# Patient Record
Sex: Female | Born: 1950 | Race: White | Hispanic: No | Marital: Married | Smoking: Former smoker
Health system: Southern US, Community
[De-identification: ages and names within clinical notes are randomized; demographics above are authoritative.]

## PROBLEM LIST (undated history)

## (undated) DIAGNOSIS — I8390 Asymptomatic varicose veins of unspecified lower extremity: Secondary | ICD-10-CM

## (undated) DIAGNOSIS — H8109 Meniere's disease, unspecified ear: Secondary | ICD-10-CM

## (undated) HISTORY — PX: TONSILLECTOMY: SUR1361

## (undated) HISTORY — DX: Asymptomatic varicose veins of unspecified lower extremity: I83.90

## (undated) HISTORY — DX: Meniere's disease, unspecified ear: H81.09

---

## 1985-10-10 HISTORY — PX: OTHER SURGICAL HISTORY: SHX169

## 1986-10-10 HISTORY — PX: ABDOMINAL HYSTERECTOMY: SHX81

## 2003-10-11 HISTORY — PX: BUNIONECTOMY: SHX129

## 2013-09-28 ENCOUNTER — Emergency Department (INDEPENDENT_AMBULATORY_CARE_PROVIDER_SITE_OTHER)
Admission: EM | Admit: 2013-09-28 | Discharge: 2013-09-28 | Disposition: A | Discharge status: Home / Self Care | Payer: 59 | Source: Home / Self Care | Attending: Emergency Medicine | Admitting: Emergency Medicine

## 2013-09-28 ENCOUNTER — Encounter (HOSPITAL_COMMUNITY): Payer: Self-pay | Admitting: Emergency Medicine

## 2013-09-28 DIAGNOSIS — H109 Unspecified conjunctivitis: Secondary | ICD-10-CM

## 2013-09-28 MED ORDER — TOBRAMYCIN 0.3 % OP SOLN
OPHTHALMIC | Status: AC
  Filled 2013-09-28: qty 5

## 2013-09-28 MED ORDER — TOBRAMYCIN 0.3 % OP SOLN
2.0000 [drp] | OPHTHALMIC | Status: DC
  Administered 2013-09-28: 2 [drp] via OPHTHALMIC

## 2013-09-28 NOTE — ED Notes (Signed)
C/o pain, itching since this PM. Encouraged to discard eye make up , practice good hand hygiene

## 2013-09-28 NOTE — ED Provider Notes (Signed)
CSN: [id number]     Arrival date & time 09/28/13  1850 History   First MD Initiated Contact with Patient 09/28/13 2002     Chief Complaint  Patient presents with  . Conjunctivitis   (Consider location/radiation/quality/duration/timing/severity/associated sxs/prior Treatment) HPI Comments: 62 year old female presents complaining of one day of right eye redness, irritation, and purulent drainage. She believes she may have pink eye. Her vision has not been affected. The pain is mild to moderate.   History reviewed. No pertinent past medical history. History reviewed. No pertinent past surgical history. History reviewed. No pertinent family history. History  Substance Use Topics  . Smoking status: Never Smoker   . Smokeless tobacco: Not on file  . Alcohol Use: Yes   OB History   Grav Para Term Preterm Abortions TAB SAB Ect Mult Living                 Review of Systems  Constitutional: Negative for fever and chills.  Eyes: Positive for discharge and redness. Negative for visual disturbance.  Respiratory: Negative for cough and shortness of breath.   Cardiovascular: Negative for chest pain, palpitations and leg swelling.  Gastrointestinal: Negative for nausea, vomiting and abdominal pain.  Endocrine: Negative for polydipsia and polyuria.  Genitourinary: Negative for dysuria, urgency and frequency.  Musculoskeletal: Negative for arthralgias and myalgias.  Skin: Negative for rash.  Neurological: Negative for dizziness, weakness and light-headedness.    Allergies  Review of patient's allergies indicates not on file.  Home Medications  No current outpatient prescriptions on file. BP 154/88  Pulse 61  Temp(Src) 98.1 F (36.7 C) (Oral)  Resp 17  SpO2 99% Physical Exam  Nursing note and vitals reviewed. Constitutional: She is oriented to person, place, and time. Vital signs are normal. She appears well-developed and well-nourished. No distress.  HENT:  Head: Normocephalic and  atraumatic.  Eyes: Right eye exhibits discharge and exudate. Right conjunctiva is injected.  Pulmonary/Chest: Effort normal. No respiratory distress.  Neurological: She is alert and oriented to person, place, and time. She has normal strength. Coordination normal.  Skin: Skin is warm and dry. No rash noted. She is not diaphoretic.  Psychiatric: She has a normal mood and affect. Judgment normal.    ED Course  Procedures (including critical care time) Labs Review Labs Reviewed - No data to display Imaging Review No results found.  EKG Interpretation    Date/Time:    Ventricular Rate:    PR Interval:    QRS Duration:   QT Interval:    QTC Calculation:   R Axis:     Text Interpretation:              MDM   1. Conjunctivitis, right eye    Given tobrex drops here bc new to area, doesn't know where a pharmacy is.  Followup when necessary   Meds ordered this encounter  Medications  . DISCONTD: tobramycin (TOBREX) 0.3 % ophthalmic solution 2 drop    Sig:      Graylon Good, PA-C 09/29/13 2006

## 2013-09-30 NOTE — ED Provider Notes (Signed)
Medical screening examination/treatment/procedure(s) were performed by non-physician practitioner and as supervising physician I was immediately available for consultation/collaboration.  Hilliary Jock, M.D.  Larwence Tu C Nyonna Hargrove, MD 09/30/13 0741 

## 2014-02-27 ENCOUNTER — Other Ambulatory Visit: Payer: Self-pay

## 2014-02-27 DIAGNOSIS — Z1231 Encounter for screening mammogram for malignant neoplasm of breast: Secondary | ICD-10-CM

## 2014-03-04 ENCOUNTER — Other Ambulatory Visit: Payer: Self-pay | Admitting: Family Medicine

## 2014-03-04 DIAGNOSIS — E2839 Other primary ovarian failure: Secondary | ICD-10-CM

## 2014-03-12 ENCOUNTER — Other Ambulatory Visit: Payer: Self-pay | Admitting: *Deleted

## 2014-03-12 DIAGNOSIS — I83893 Varicose veins of bilateral lower extremities with other complications: Secondary | ICD-10-CM

## 2014-03-17 ENCOUNTER — Ambulatory Visit
Admission: RE | Admit: 2014-03-17 | Discharge: 2014-03-17 | Disposition: A | Discharge status: Home / Self Care | Payer: 59 | Source: Ambulatory Visit

## 2014-03-17 DIAGNOSIS — Z1231 Encounter for screening mammogram for malignant neoplasm of breast: Secondary | ICD-10-CM

## 2014-03-27 ENCOUNTER — Encounter: Payer: Self-pay | Admitting: Vascular Surgery

## 2014-03-28 ENCOUNTER — Ambulatory Visit
Admission: RE | Admit: 2014-03-28 | Discharge: 2014-03-28 | Disposition: A | Discharge status: Home / Self Care | Payer: 59 | Source: Ambulatory Visit | Attending: Family Medicine | Admitting: Family Medicine

## 2014-03-28 DIAGNOSIS — E2839 Other primary ovarian failure: Secondary | ICD-10-CM

## 2014-05-02 ENCOUNTER — Encounter (HOSPITAL_COMMUNITY): Payer: 59

## 2014-05-02 ENCOUNTER — Encounter: Payer: 59 | Admitting: Vascular Surgery

## 2014-05-21 ENCOUNTER — Encounter (HOSPITAL_COMMUNITY): Payer: 59

## 2014-05-21 ENCOUNTER — Encounter: Payer: 59 | Admitting: Vascular Surgery

## 2014-06-05 ENCOUNTER — Encounter: Payer: Self-pay | Admitting: Vascular Surgery

## 2014-06-06 ENCOUNTER — Encounter: Payer: Self-pay | Admitting: Vascular Surgery

## 2014-06-06 ENCOUNTER — Ambulatory Visit (INDEPENDENT_AMBULATORY_CARE_PROVIDER_SITE_OTHER): Payer: 59 | Admitting: Vascular Surgery

## 2014-06-06 ENCOUNTER — Ambulatory Visit (HOSPITAL_COMMUNITY)
Admission: RE | Admit: 2014-06-06 | Discharge: 2014-06-06 | Disposition: A | Discharge status: Home / Self Care | Payer: 59 | Source: Ambulatory Visit | Attending: Vascular Surgery | Admitting: Vascular Surgery

## 2014-06-06 VITALS — BP 126/59 | HR 62 | Temp 97.8°F | Resp 16 | Ht 62.5 in | Wt 122.0 lb

## 2014-06-06 DIAGNOSIS — I83893 Varicose veins of bilateral lower extremities with other complications: Secondary | ICD-10-CM | POA: Insufficient documentation

## 2014-06-06 DIAGNOSIS — I82819 Embolism and thrombosis of superficial veins of unspecified lower extremities: Secondary | ICD-10-CM | POA: Insufficient documentation

## 2014-06-06 NOTE — Progress Notes (Signed)
Referred by:  Gaye Alken, MD [address] [place], [place] [address]  Reason for referral: Swollen painful B leg  History of Present Illness  Jodi White is a 63 y.o. (Sep 24, 1951) female who presents with chief complaint: B swollen pain leg.  Patient has long standing history of varicose veins, previously has undergone stab phlebectomy in both lower leg with sclerotherapy >20 years ago.  She notes increased swelling recently without any obvious etiology.  She note bilateral calf swelling with distension sensation and pruritius.  The patient has had no history of DVT, known history of pregnancy, known history of varicose vein, no history of venous stasis ulcers, no history of  Lymphedema and no history of skin changes in lower legs.  There is known family history of venous disorders.  The patient has used therapeutic compression stockings in the past but not recently  Past Medical History  Diagnosis Date  . Meniere disease     Past Surgical History  Procedure Laterality Date  . Abdominal hysterectomy  1988    History   Social History  . Marital Status: Divorced    Spouse Name: N/A    Number of Children: N/A  . Years of Education: N/A   Occupational History  . Not on file.   Social History Main Topics  . Smoking status: Never Smoker   . Smokeless tobacco: Not on file  . Alcohol Use: No  . Drug Use: No  . Sexual Activity: Not on file   Other Topics Concern  . Not on file   Social History Narrative  . No narrative on file    Family History  Problem Relation Age of Onset  . Cancer Mother   . Hyperlipidemia Mother   . Varicose Veins Mother   . COPD Father   . Heart disease Father   . Cancer Brother     Current Outpatient Prescriptions on File Prior to Visit  Medication Sig Dispense Refill  . Meclizine HCl 25 MG CHEW Chew 1 tablet by mouth 3 (three) times daily as needed.      . Multiple Vitamin (MULTIVITAMIN) tablet Take 1 tablet by mouth  daily.      . ondansetron (ZOFRAN) 8 MG tablet Take 8 mg by mouth every 8 (eight) hours as needed for nausea or vomiting.       No current facility-administered medications on file prior to visit.    No Known Allergies   REVIEW OF SYSTEMS:  (Positives checked otherwise negative)  CARDIOVASCULAR:   chest pain,  chest pressure,  palpitations,  shortness of breath when laying flat,  shortness of breath with exertion,   pain in feet when walking,  pain in feet when laying flat,  history of blood clot in veins (DVT),  history of phlebitis,  swelling in legs,  varicose veins  PULMONARY:   productive cough,  asthma,  wheezing  NEUROLOGIC:   weakness in arms or legs,  numbness in arms or legs,  difficulty speaking or slurred speech,  temporary loss of vision in one eye,  dizziness  HEMATOLOGIC:   bleeding problems,  problems with blood clotting too easily  MUSCULOSKEL:   joint pain,  joint swelling  GASTROINTEST:   vomiting blood,  blood in stool     GENITOURINARY:   burning with urination,  blood in urine  PSYCHIATRIC:   history of major depression  INTEGUMENTARY:   rashes,  ulcers  CONSTITUTIONAL:   fever,  chills   Physical Examination  Filed Vitals:   06/06/14 1421  BP: 126/59  Pulse: 62  Temp: 97.8 F (36.6 C)  TempSrc: Oral  Resp: 16  Height: 5' 2.5" (1.588 m)  Weight: 122 lb (55.339 kg)  SpO2: 99%   Body mass index is 21.94 kg/(m^2).  General: A&O x 3, WDWN  Head: [place]/AT  Ear/Nose/Throat: Hearing grossly intact, nares w/o erythema or drainage, oropharynx w/o Erythema/Exudate  Eyes: PERRLA, EOMI  Neck: Supple, no nuchal rigidity, no palpable LAD  Pulmonary: Sym exp, good air movt, CTAB, no rales, rhonchi, & wheezing  Cardiac: RRR, Nl S1, S2, no Murmurs, rubs or gallops  Vascular: Vessel Right Left  Radial Palpable Palpable  Brachial Palpable Palpable  Carotid Palpable, without bruit  Palpable, without bruit  Aorta Not palpable N/A  Femoral Palpable Palpable  Popliteal Not palpable Not palpable  PT Palpable Palpable  DP Palpable Palpable   Gastrointestinal: soft, NTND, -G/R, - HSM, - masses, - CVAT B  Musculoskeletal: M/S 5/5 throughout , Extremities without ischemic changes , extensive spider veins B, mild TTP bilateral posterior calves, multiple healed stab incision, no LDS, visible varicosities (R>L)  Neurologic: Pain and light touch intact in extremities , Motor exam as listed above  Psychiatric: Judgment intact, Mood & affect appropriate for pt's clinical situation  Dermatologic: See M/S exam for extremity exam, no rashes otherwise noted  Lymph : No Cervical, Axillary, or Inguinal lymphadenopathy   Non-Invasive Vascular Imaging  BLE Venous Insufficiency Duplex (Date: 06/06/2014):   RLE: - DVT, + SVT: non-occluding thrombus in SSV, + GSV reflux: limited mid-segment, - deep venous reflux  LLE: - DVT, +  SVT: non-occluding thrombus in SSV, + GSV reflux: limited antecubital, - deep venous reflux  Outside Studies/Documentation 1 pages of outside documents were reviewed including: outpatient PCP.  Medical Decision Making  Jodi White is a 63 y.o. female who presents with: sx BLE varicose veins   Based on the patient's history and examination, I recommend: comrpessive therapy.  I discussed with the patient the use of her 20-30 mm thigh high compression stockings and need for 3 month trial of such.  The patient will follow up in 3 months with my [facility] for evaluation for: sclerotherapy vs stab phlebectomy vs EVLA.  I suspect a combination of interventions will be needed  Thank you for allowing [place] to participate in this patient's care.  Leonides Sake, MD [facility] Office: [phone] Pager: [phone]  06/06/2014, 2:55 PM

## 2014-09-08 ENCOUNTER — Encounter: Payer: Self-pay | Admitting: Vascular Surgery

## 2014-09-09 ENCOUNTER — Ambulatory Visit (INDEPENDENT_AMBULATORY_CARE_PROVIDER_SITE_OTHER): Payer: 59 | Admitting: Vascular Surgery

## 2014-09-09 ENCOUNTER — Encounter: Payer: Self-pay | Admitting: Vascular Surgery

## 2014-09-09 VITALS — BP 124/57 | HR 70 | Resp 18 | Ht 62.5 in | Wt 127.0 lb

## 2014-09-09 DIAGNOSIS — I83899 Varicose veins of unspecified lower extremities with other complications: Secondary | ICD-10-CM | POA: Insufficient documentation

## 2014-09-09 DIAGNOSIS — I83893 Varicose veins of bilateral lower extremities with other complications: Secondary | ICD-10-CM

## 2014-09-09 NOTE — Progress Notes (Signed)
Problems with Activities of Daily Living Secondary to Leg Pain  1. Jodi White states she has great difficulty taking care of her grandson due to leg pain.  2. Jodi White states that she is unable to walk for exercise due to leg pain.   3. Jodi White states she has difficulty falling asleep due to leg pain.    4.  Jodi White states all activities that require prolonged standing are difficult due to leg pain.     Failure of  Conservative Therapy:  1. Worn 20-30 mm Hg thigh high compression hose >3 months with no relief of symptoms.  2. Frequently elevates legs-no relief of symptoms  3. Taken Ibuprofen 600 Mg TID with no relief of symptoms.  Here today for continued discussion of venous pathology. I did review her prior formal ultrasound from 06/06/2014 bilateral lower extremity veins and also reimage the area of her right small saphenous with SonoSite ultrasound. He does not have any evidence of significant deep venous reflux and has no evidence of DVT.  She continues to have vague discomfort over her pretibial area bilaterally and her lateral thighs bilaterally. She does have prominent reticular veins over the left lateral thigh and reports that she does have some discomfort specifically over these veins. She has marked spider vein telangiectasia over both legs.  I reimage the area where she had partial thrombus in her right small saphenous vein I do not see any evidence of thrombus currently. She does have multiple collaterals to this area.  I explained that I do not see any venous pathology that can explain the discomfort in her legs. I explained that the areas specifically over the reticular veins in her left lateral thigh may be improved with treatment with sclerotherapy but that it would be impossible to know this without treatment. I explained that this just as likely could be related other sources and do not feel that her pretibial discomfort is related to telangiectasia. I did  explain the treatment of sclerotherapy and she reports that she had had this done prior in [place] with good result. I expect this would not be covered by insurance and that would be impossible to know whether she would have symptom relief without treatment. She will notify us if she wishes to proceed with sclerotherapy treatments

## 2015-01-06 ENCOUNTER — Encounter: Payer: Self-pay | Admitting: *Deleted

## 2015-01-07 ENCOUNTER — Ambulatory Visit (INDEPENDENT_AMBULATORY_CARE_PROVIDER_SITE_OTHER): Payer: 59 | Admitting: *Deleted

## 2015-01-07 DIAGNOSIS — I8393 Asymptomatic varicose veins of bilateral lower extremities: Secondary | ICD-10-CM

## 2015-01-07 NOTE — Progress Notes (Signed)
X=.3% Sotradecol administered with a 27g butterfly.  Patient received a total of 12cc.  She has lots of retics and spider veins. Treated from the knee down only on the front of both legs. Unable to do more due to the number of veins. She has reflux in some areas so not sure if this will work. Had a lengthy discussion about the pros and cons to tx. She is getting married at the end of May and really wants to try to look better. Easy access. Tol well. Will follow prn.   Photos: No.Camera not working.  Compression stockings applied: Yes.

## 2015-01-08 ENCOUNTER — Encounter: Payer: Self-pay | Admitting: *Deleted

## 2015-04-15 ENCOUNTER — Other Ambulatory Visit: Payer: Self-pay | Admitting: Family Medicine

## 2015-04-15 ENCOUNTER — Ambulatory Visit
Admission: RE | Admit: 2015-04-15 | Discharge: 2015-04-15 | Disposition: A | Discharge status: Home / Self Care | Payer: 59 | Source: Ambulatory Visit | Attending: Family Medicine | Admitting: Family Medicine

## 2015-04-15 DIAGNOSIS — M542 Cervicalgia: Secondary | ICD-10-CM

## 2015-04-15 DIAGNOSIS — T1490XA Injury, unspecified, initial encounter: Secondary | ICD-10-CM

## 2015-04-22 ENCOUNTER — Other Ambulatory Visit: Payer: Self-pay | Admitting: Family Medicine

## 2015-04-22 DIAGNOSIS — I6529 Occlusion and stenosis of unspecified carotid artery: Secondary | ICD-10-CM

## 2015-04-23 ENCOUNTER — Other Ambulatory Visit: Payer: Self-pay

## 2015-04-23 DIAGNOSIS — Z1231 Encounter for screening mammogram for malignant neoplasm of breast: Secondary | ICD-10-CM

## 2015-04-30 ENCOUNTER — Inpatient Hospital Stay: Admission: RE | Admit: 2015-04-30 | Payer: 59 | Source: Ambulatory Visit

## 2015-05-04 ENCOUNTER — Ambulatory Visit: Payer: 59 | Attending: Family Medicine

## 2015-05-04 DIAGNOSIS — M436 Torticollis: Secondary | ICD-10-CM | POA: Insufficient documentation

## 2015-05-04 DIAGNOSIS — M542 Cervicalgia: Secondary | ICD-10-CM

## 2015-05-04 NOTE — Patient Instructions (Addendum)
  UPPER TRAP STRETCH  Begin by retracting your head back into a chin tuck position. Next, place one hand behind your back and gently draw your head towards the opposite side with the help of your other arm.  Do 2x each side, 20 seconds each side. Do 3x a day.    CERVICAL ROTATION  Turn your head towards the side, then return back to looking straight ahead.  Do 10x each side    LEVATOR SCAPULAE STRETCH - MODIFIED  Grasp your arm of the affected side and pull it gently towards the opposite side in front of your body.  Next, tilt your head downward and to the side looking away from the affected side until a stretch is felt.  Hold for 20 seconds each side, do 2-3x each side. Do 3x a day.     Posture - Standing   Good posture is important. Avoid slouching and forward head thrust. Maintain curve in low back and align ears over shoulders, hips over ankles.  Pull your belly button in toward your back bone. Posture Tips DO: - stand tall and erect - keep chin tucked in - keep head and shoulders in alignment - check posture regularly in mirror or large window - pull head back against headrest in car seat;  Change your position often.  Sit with lumbar support. DON'T: - slouch or slump while watching TV or reading - sit, stand or lie in one position  for too long;  Sitting is especially hard on the spine so if you sit at a desk/use the computer, then stand up often! Copyright  VHI. All rights reserved.  Posture - Sitting  Sit upright, head facing forward. Try using a roll to support lower back. Keep shoulders relaxed, and avoid rounded back. Keep hips level with knees. Avoid crossing legs for long periods. Copyright  VHI. All rights reserved.  Chronic neck strain can develop because of poor posture and faulty work habits  Postural strain related to slumped sitting and forward head posture is a leading cause of headaches, neck and upper back pain  General strengthening and flexibility exercises  are helpful in the treatment of neck pain.  Most importantly, you should learn to correct the posture that may be contributing to chronic pain.   Change positions frequently  Change your work or home environment to improve posture and mechanics.   [facility] [address], [address] [place], [place] [address] Phone # [phone] Fax [phone]  
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
## 2015-05-11 ENCOUNTER — Ambulatory Visit: Payer: 59 | Attending: Family Medicine | Admitting: Physical Therapy

## 2015-05-11 ENCOUNTER — Encounter: Payer: Self-pay | Admitting: Physical Therapy

## 2015-05-11 DIAGNOSIS — M542 Cervicalgia: Secondary | ICD-10-CM | POA: Diagnosis not present

## 2015-05-11 DIAGNOSIS — M436 Torticollis: Secondary | ICD-10-CM | POA: Diagnosis present

## 2015-05-11 NOTE — Therapy (Signed)
[facility] [address] [place], [place], [address] Phone: [phone]   Fax:  [phone]  Physical Therapy Treatment  Patient Details  Name: Jodi White MRN: [id number] Date of Birth: 03-02-1951 Referring Provider:  Juluis Rainier, MD  Encounter Date: 05/11/2015      PT End of Session - 05/11/15 1041    Visit Number 2   Date for PT Re-Evaluation 06/29/15   PT Start Time 1016   PT Stop Time 1116   PT Time Calculation (min) 60 min   Activity Tolerance Patient tolerated treatment well   Behavior During Therapy [facility] for tasks assessed/performed      Past Medical History  Diagnosis Date  . Meniere disease   . Varicose veins     Past Surgical History  Procedure Laterality Date  . Abdominal hysterectomy  1988  . Bunionectomy Bilateral 2005  . Stab phlebectomy Bilateral 1987    There were no vitals filed for this visit.  Visit Diagnosis:  Neck pain on left side  Neck stiffness      Subjective Assessment - 05/11/15 1026    Subjective Pt felt improvement after evaluation, the stretching seem to help. Pt hurt neck while on vacation in July. Pt prefers ice over heat.    How long can you sit comfortably? Immediately feels pain with sitting    Diagnostic tests radiograph - see images    Patient Stated Goals Reduce the pain and what is causing it,    Currently in Pain? Yes   Pain Score 5    Pain Location Neck   Pain Orientation Left   Pain Descriptors / Indicators Tightness;Stabbing   Pain Type Acute pain   Pain Radiating Towards Pain radiates down Lt arm/biceps   Pain Onset More than a month ago   Pain Frequency Constant   Aggravating Factors  sitting on metal chair, putting pressure on the area, tilt head to the left, having Lt arm up   Pain Relieving Factors Advil, ice   Effect of Pain on Daily Activities pulling to make up a bed, cleaning the tub, bending to do an activity   Multiple Pain Sites  No                         [facility] Adult PT Treatment/Exercise - 05/11/15 0001    Exercises   Exercises Neck;Shoulder   Neck Exercises: Machines for Strengthening   UBE (Upper Arm Bike) L2 6(3/3)   Neck Exercises: Supine   Capital Flexion 20 reps  with small towel on OA   Cervical Rotation Left;10 reps  with small towel roll   Shoulder Exercises: Body Blade   Other Body Blade Exercises Thoracic selfmobilisation x 3 min   Modalities   Modalities Traction   Traction   Type of Traction Cervical   Min (lbs) 5   Max (lbs) 16   Hold Time 60   Rest Time 10   Time 15   Manual Therapy   Manual Therapy Soft tissue mobilization  STW to cervical paraspinals, SCM and scalenies   Soft tissue mobilization with PROM cervical to incre elongation                  PT Short Term Goals - 05/11/15 1043    PT SHORT TERM GOAL #1   Title Independent with HEP    Time 4   Period Weeks   Status On-going   PT SHORT TERM  GOAL #2   Title Report 30% improvement in neck pain with performing daily household chores    Time 4   Period Weeks   Status On-going   PT SHORT TERM GOAL #3   Title Improve sitting time to 10 minutes without Lt. shoulder pain    Time 4   Period Weeks   Status On-going           PT Long Term Goals - 05/11/15 1044    PT LONG TERM GOAL #1   Title Be independent with advanced HEP    Time 8   Period Weeks   Status On-going   PT LONG TERM GOAL #2   Title Increase Lt. AROM cervical rotation to 50 degrees to improve ability to look over shoulder when driving    Time 8   Period Weeks   Status On-going   PT LONG TERM GOAL #3   Title Report 70% improvement in Lt. neck pain with daily household opportunities    Time 8   Period Weeks   Status On-going   PT LONG TERM GOAL #4   Title Improve FOTO score to < or = 26% limitation    Time 8   Period Weeks   Status On-going               Plan - 05/11/15 1042    Clinical Impression  Statement Pt is a 64 year old female with Lt sided neck pain that began 4 weeks ago. Pt reports expierience relieve with stretching exercises. Left upper trapezius with increased muscle tenderness   Pt will benefit from skilled therapeutic intervention in order to improve on the following deficits Decreased range of motion;Decreased activity tolerance;Pain;Decreased mobility   Rehab Potential Good   PT Frequency 2x / week   PT Duration 8 weeks   PT Treatment/Interventions Moist Heat;Traction;Therapeutic activities;Therapeutic exercise;Patient/family education;Electrical Stimulation;Cryotherapy;Iontophoresis /ml Dexamethasone;Functional mobility training;Ultrasound;Neuromuscular re-education;Manual techniques;Passive range of motion   PT Next Visit Plan Add self mob thoracic to HEP, review exercises, e-stim, pt prefers ice vs. heat, manual therapy to UT, postural strengthening exericses    Consulted and Agree with Plan of Care Patient        Problem List Patient Active Problem List   Diagnosis Date Noted  . Varicose veins of lower extremities with complications 09/09/2014  . Varicose veins of lower extremities with other complications 06/06/2014    NAUMANN-HOUEGNIFIO,Izyk Marty PTA 05/11/2015, 11:03 AM  [facility] [address] [place], [place], [address] Phone: [phone]   Fax:  [phone]

## 2015-05-13 ENCOUNTER — Encounter: Payer: Self-pay | Admitting: Physical Therapy

## 2015-05-13 ENCOUNTER — Ambulatory Visit: Payer: 59 | Admitting: Physical Therapy

## 2015-05-13 DIAGNOSIS — M542 Cervicalgia: Secondary | ICD-10-CM

## 2015-05-13 DIAGNOSIS — M436 Torticollis: Secondary | ICD-10-CM

## 2015-05-13 NOTE — Therapy (Signed)
[facility] [address], [address] [place], [place], [address] Phone: [phone]   Fax:  [phone]  Physical Therapy Treatment  Patient Details  Name: Jodi White MRN: [id number] Date of Birth: [birth year] Referring Provider:  Juluis Rainier, MD  Encounter Date: 05/13/2015      PT End of Session - 05/13/15 1003    Visit Number 3   Date for PT Re-Evaluation 06/29/15   PT Start Time 0938   PT Stop Time 1010   PT Time Calculation (min) 32 min   Activity Tolerance Patient tolerated treatment well   Behavior During Therapy [facility] for tasks assessed/performed      Past Medical History  Diagnosis Date  . Meniere disease   . Varicose veins     Past Surgical History  Procedure Laterality Date  . Abdominal hysterectomy  1988  . Bunionectomy Bilateral 2005  . Stab phlebectomy Bilateral 1987    There were no vitals filed for this visit.  Visit Diagnosis:  Neck pain on left side  Neck stiffness      Subjective Assessment - 05/13/15 0940    Subjective Each day better.    Currently in Pain? No/denies                         [facility] Adult PT Treatment/Exercise - 05/13/15 0001    Neck Exercises: Machines for Strengthening   UBE (Upper Arm Bike) L2 3x3   Traction   Type of Traction Cervical   Min (lbs) 5   Max (lbs) 16   Hold Time 60   Rest Time 20   Time 15   Manual Therapy   Manual Therapy Soft tissue mobilization  STW to cervical paraspinals, SCM and scalenies                  PT Short Term Goals - 05/11/15 1043    PT SHORT TERM GOAL #1   Title Independent with HEP    Time 4   Period Weeks   Status On-going   PT SHORT TERM GOAL #2   Title Report 30% improvement in neck pain with performing daily household chores    Time 4   Period Weeks   Status On-going   PT SHORT TERM GOAL #3   Title Improve sitting time to 10 minutes without Lt. shoulder pain    Time 4   Period Weeks    Status On-going           PT Long Term Goals - 05/11/15 1044    PT LONG TERM GOAL #1   Title Be independent with advanced HEP    Time 8   Period Weeks   Status On-going   PT LONG TERM GOAL #2   Title Increase Lt. AROM cervical rotation to 50 degrees to improve ability to look over shoulder when driving    Time 8   Period Weeks   Status On-going   PT LONG TERM GOAL #3   Title Report 70% improvement in Lt. neck pain with daily household opportunities    Time 8   Period Weeks   Status On-going   PT LONG TERM GOAL #4   Title Improve FOTO score to < or = 26% limitation    Time 8   Period Weeks   Status On-going               Plan - 05/13/15 1003    Clinical Impression Statement  Essentially no pain, soft tissues of neck feeling softer. Pt progressing towards all goals nicely.   Pt will benefit from skilled therapeutic intervention in order to improve on the following deficits Decreased range of motion;Decreased activity tolerance;Pain;Decreased mobility   Rehab Potential Good   PT Frequency 2x / week   PT Duration 8 weeks   PT Treatment/Interventions Moist Heat;Traction;Therapeutic activities;Therapeutic exercise;Patient/family education;Electrical Stimulation;Cryotherapy;Iontophoresis /ml Dexamethasone;Functional mobility training;Ultrasound;Neuromuscular re-education;Manual techniques;Passive range of motion   PT Next Visit Plan Begin postural strengthening next visit.   Consulted and Agree with Plan of Care Patient        Problem List Patient Active Problem List   Diagnosis Date Noted  . Varicose veins of lower extremities with complications 09/09/2014  . Varicose veins of lower extremities with other complications 06/06/2014    COCHRAN,JENNIFER, PTA 05/13/2015, 10:08 AM  [facility] [address] [place], [place], [address] Phone: [phone]   Fax:  [phone]

## 2015-05-18 ENCOUNTER — Ambulatory Visit: Payer: 59 | Admitting: Physical Therapy

## 2015-05-20 ENCOUNTER — Encounter: Payer: 59 | Admitting: Physical Therapy

## 2015-05-28 ENCOUNTER — Ambulatory Visit: Payer: 59 | Admitting: Physical Therapy

## 2015-05-28 ENCOUNTER — Encounter: Payer: Self-pay | Admitting: Physical Therapy

## 2015-05-28 DIAGNOSIS — M436 Torticollis: Secondary | ICD-10-CM

## 2015-05-28 DIAGNOSIS — M542 Cervicalgia: Secondary | ICD-10-CM | POA: Diagnosis not present

## 2015-05-28 NOTE — Therapy (Signed)
[facility] [address], [place], [place], [address] Phone: [phone]   Fax:  [phone]  Physical Therapy Treatment  Patient Details  Name: Jodi White MRN: [id number] Date of Birth: Nov 22, 1950 Referring Provider:  Leighton Ruff, MD  Encounter Date: 05/28/2015      PT End of Session - 05/28/15 0937    Visit Number 4   Date for PT Re-Evaluation 06/29/15   Authorization Type [employer]   Authorization Time Period 07/05/2015   Authorization - Visit Number 4   Authorization - Number of Visits 16   PT Start Time 0928   PT Stop Time 1010   PT Time Calculation (min) 42 min   Activity Tolerance Patient tolerated treatment well   Behavior During Therapy [facility] for tasks assessed/performed      Past Medical History  Diagnosis Date  . Meniere disease   . Varicose veins     Past Surgical History  Procedure Laterality Date  . Abdominal hysterectomy  1988  . Bunionectomy Bilateral 2005  . Stab phlebectomy Bilateral 1987    There were no vitals filed for this visit.  Visit Diagnosis:  Neck pain on left side  Neck stiffness      Subjective Assessment - 05/28/15 0928    Subjective therapy has helped. I am now stiff due to not coming to therapy due to vacation.    How long can you sit comfortably? Immediately feels pain with sitting    Diagnostic tests radiograph - see images    Patient Stated Goals Reduce the pain and what is causing it,    Currently in Pain? Yes   Pain Score 6    Pain Location Neck  left shoulder   Pain Orientation Left   Pain Descriptors / Indicators Tightness   Pain Type Acute pain   Aggravating Factors  turning quickly, worse at night laying down,    Pain Relieving Factors advil, ice, traction   Effect of Pain on Daily Activities clean the tub, bending to do an activiity            [facility] PT Assessment - 05/28/15 0001    Assessment   Medical Diagnosis M54.2 - Neck pain e    Onset Date/Surgical Date 04/10/15   Hand Dominance Right   Prior Therapy For neck pain, 4 years ago    Precautions   Precautions None   Prior Function   Level of Independence Independent   Cognition   Overall Cognitive Status Within Functional Limits for tasks assessed   AROM   Cervical Flexion full   Cervical Extension decreased by 25% with pain   Cervical - Right Side Bend decreased by 25% with pulling on left   Cervical - Left Side Bend full   Cervical - Right Rotation full   Cervical - Left Rotation full                     [facility] Adult PT Treatment/Exercise - 05/28/15 0001    Neck Exercises: Machines for Strengthening   UBE (Upper Arm Bike) L2 3x3   Neck Exercises: Supine   Neck Retraction 10 reps;5 secs  press into red physioball   Cervical Rotation 10 reps;Left;Both  press head into red physioball   Shoulder Flexion 10 reps  head press into red physioball with alternate shoulder flexi   Traction   Type of Traction Cervical  home saunders unit   Max (lbs) 10   Hold Time 10 min  Time 10 min   Manual Therapy   Manual Therapy Soft tissue mobilization  STW to cervical paraspinals, SCM and scalenies   Soft tissue mobilization cervical paraspinals, and suboccipitals                PT Education - 05/28/15 1008    Education provided No          PT Short Term Goals - 05/28/15 0939    PT SHORT TERM GOAL #1   Title Independent with HEP    Time 4   Period Weeks   Status Achieved   PT SHORT TERM GOAL #2   Title Report 30% improvement in neck pain with performing daily household chores    Time 4   Period Weeks   Status Achieved   PT SHORT TERM GOAL #3   Title Improve sitting time to 10 minutes without Lt. shoulder pain    Time 4   Period Weeks   Status Achieved           PT Long Term Goals - 05/28/15 0940    PT LONG TERM GOAL #1   Title Be independent with advanced HEP    Time 8   Period Weeks   Status On-going  still learning  exercises   PT LONG TERM GOAL #2   Title Increase Lt. AROM cervical rotation to 50 degrees to improve ability to look over shoulder when driving    Time 8   Period Weeks   Status On-going  pain decreased by 35%   PT LONG TERM GOAL #3   Title Report 70% improvement in Lt. neck pain with daily household opportunities    Time 8   Period Weeks   Status On-going  25% better               Plan - 05/28/15 1008    Clinical Impression Statement Patient has met all of her STG's.  Patient has improved cervical sidebending and left rotation.  Patient reports it is difficult to watch TV due to her head feeling very heaviy and hard to hold it in one position.  Patient able to pull up sheets with left arm.  Patient is able to lift left arm overhead without pain.Patient has decreased  by 35%  with driving. Patient will benefit from physical therapy to decrease cervical pain and  improve ROM.    Pt will benefit from skilled therapeutic intervention in order to improve on the following deficits Decreased range of motion;Decreased activity tolerance;Pain;Decreased mobility   Rehab Potential Good   Clinical Impairments Affecting Rehab Potential None   PT Frequency 2x / week   PT Duration 8 weeks   PT Treatment/Interventions Moist Heat;Traction;Therapeutic activities;Therapeutic exercise;Patient/family education;Electrical Stimulation;Cryotherapy;Iontophoresis 58m/ml Dexamethasone;Functional mobility training;Ultrasound;Neuromuscular re-education;Manual techniques;Passive range of motion   PT Next Visit Plan continue with traction, cervical thoracic strengthening   PT Home Exercise Plan cervical thoracic strengthening   Consulted and Agree with Plan of Care Patient        Problem List Patient Active Problem List   Diagnosis Date Noted  . Varicose veins of lower extremities with complications 129/47/6546 . Varicose veins of lower extremities with other complications 050/35/4656    Kyvon Hu,PT 05/28/2015, 10:15 AM  [facility] [address] [place] [address]Phone: [phone]  Fax:  [phone]

## 2015-06-01 ENCOUNTER — Ambulatory Visit
Admission: RE | Admit: 2015-06-01 | Discharge: 2015-06-01 | Disposition: A | Discharge status: Home / Self Care | Payer: 59 | Source: Ambulatory Visit

## 2015-06-01 DIAGNOSIS — Z1231 Encounter for screening mammogram for malignant neoplasm of breast: Secondary | ICD-10-CM

## 2015-06-09 ENCOUNTER — Ambulatory Visit: Payer: 59 | Admitting: Physical Therapy

## 2015-06-09 ENCOUNTER — Encounter: Payer: Self-pay | Admitting: Physical Therapy

## 2015-06-09 DIAGNOSIS — M542 Cervicalgia: Secondary | ICD-10-CM

## 2015-06-09 DIAGNOSIS — M436 Torticollis: Secondary | ICD-10-CM

## 2015-06-09 NOTE — Patient Instructions (Signed)
PERFORM ALL EXERCISES GENTLY AND WITH GOOD POSTURE.    20 SECOND HOLD, 3 REPS TO EACH SIDE. 4-5 TIMES EACH DAY.   AROM: Neck Rotation   Turn head slowly to look over one shoulder, then the other.   AROM: Neck Flexion  Neck Side-Bending   Begin with chin level, head centered over spine. Slowly lower one ear toward shoulder keeping shoulder down. Hold __3__ seconds. Slowly return to starting position. Repeat to other side.  Repeat __5-10__ times to each side. Do 2-3 times a day. Also stretch by looking over right shoulder, then left.  Scapular Retraction (Standing)   With arms at sides, pinch shoulder blades together.  Hold 3 seconds. Repeat __5-10__ times per set.  Do __2-3__ sessions per day.   Active ROM Flexion    Raise arm to point to ceiling, keeping elbow straight. Hold __3__ seconds. Repeat _5-10___ times. Do _2-3___ sessions per day. Activity: Use this motion to reach for items on overhead shelves.           Actively bend fingers of right hand. Start with knuckles furthest from palm, and slowly make a fist. Hold __3__ seconds. Relax. Then straighten fingers as far as possible. Repeat _5-10___ times per set.  Do __2-3__ sessions per day. Also spread fingers as far as able, then bring them back together.  Copyright  [employer]. All rights reserved.   If any pain/tingling symptoms occur, try all of these exercises first to promote circulation. If symptoms do not ease off, then remove bandages and bring all wrappings back to next appointment.

## 2015-06-09 NOTE — Therapy (Signed)
[facility] [address] [place], [place], [address] Phone: [phone]   Fax:  [phone]  Physical Therapy Treatment  Patient Details  Name: Jodi White MRN: [id number] Date of Birth: 1951/09/18 Referring Provider:  Clayborn Heron, MD  Encounter Date: 06/09/2015      PT End of Session - 06/09/15 0903    Visit Number 5   Date for PT Re-Evaluation 06/29/15   PT Start Time 0847   PT Stop Time 0944   PT Time Calculation (min) 57 min   Activity Tolerance Patient tolerated treatment well   Behavior During Therapy [facility] for tasks assessed/performed      Past Medical History  Diagnosis Date  . Meniere disease   . Varicose veins     Past Surgical History  Procedure Laterality Date  . Abdominal hysterectomy  1988  . Bunionectomy Bilateral 2005  . Stab phlebectomy Bilateral 1987    There were no vitals filed for this visit.  Visit Diagnosis:  Neck pain on left side  Neck stiffness      Subjective Assessment - 06/09/15 0851    Subjective Pt reports 85% improvement, can turn the head and is able to go back to the [facility], pt is back in her routine, but still sorness and stiffness mainly on left upper trapezius   Currently in Pain? Yes   Pain Score 2    Pain Location Neck   Pain Orientation Left   Pain Descriptors / Indicators Tightness;Sore   Pain Type Chronic pain   Pain Onset More than a month ago   Pain Frequency Constant   Multiple Pain Sites No                         [facility] Adult PT Treatment/Exercise - 06/09/15 0001    Neck Exercises: Machines for Strengthening   UBE (Upper Arm Bike) L2 3x3   Neck Exercises: Seated   Cervical Rotation Both;5 reps   Lateral Flexion Both;5 reps   Other Seated Exercise upper trapezius stretch x 3 with 20 sec hold each side   Neck Exercises: Supine   Neck Retraction 10 reps;5 secs   Cervical Rotation 10 reps;Left;Both   Shoulder Flexion 10  reps  head press into red physioball   Neck Exercises: Prone   Neck Retraction 20 reps;Other (comment)   Shoulder Exercises: Standing   Other Standing Exercises 3 way raises with 1# x 10 each   Manual Therapy   Manual Therapy Soft tissue mobilization  STW to cervical spine and bilt UT   Soft tissue mobilization cervical paraspinals, and suboccipitals                PT Education - 06/09/15 0922    Education provided Yes   Education Details strengthening   Person(s) Educated Patient   Methods Explanation;Demonstration   Comprehension Verbalized understanding;Returned demonstration          PT Short Term Goals - 05/28/15 0939    PT SHORT TERM GOAL #1   Title Independent with HEP    Time 4   Period Weeks   Status Achieved   PT SHORT TERM GOAL #2   Title Report 30% improvement in neck pain with performing daily household chores    Time 4   Period Weeks   Status Achieved   PT SHORT TERM GOAL #3   Title Improve sitting time to 10 minutes without Lt. shoulder pain  Time 4   Period Weeks   Status Achieved           PT Long Term Goals - 06/09/15 0914    PT LONG TERM GOAL #1   Title Be independent with advanced HEP    Time 8   Period Weeks   Status On-going   PT LONG TERM GOAL #2   Title Increase Lt. AROM cervical rotation to 50 degrees to improve ability to look over shoulder when driving    Time 8   Period Weeks   Status On-going   PT LONG TERM GOAL #3   Title Report 70% improvement in Lt. neck pain with daily household opportunities    Time 8   Period Weeks   Status On-going   PT LONG TERM GOAL #4   Title Improve FOTO score to < or = 26% limitation    Time 8   Period Weeks   Status On-going               Plan - 06/09/15 0904    Clinical Impression Statement Patient with good ROM cervical ROM, but pt has difficulties to watch TV due to her head feeling heavy and hard to hold it in position. Pt. with weak postural muscles upper back and  neck as observed with activities in prone. Pt will continue to benefit from  PT  for strengthening and flexibility.   Pt will benefit from skilled therapeutic intervention in order to improve on the following deficits Decreased range of motion;Decreased activity tolerance;Pain;Decreased mobility   Rehab Potential Good   PT Frequency 2x / week   PT Duration 8 weeks   PT Treatment/Interventions Moist Heat;Traction;Therapeutic activities;Therapeutic exercise;Patient/family education;Electrical Stimulation;Cryotherapy;Iontophoresis /ml Dexamethasone;Functional mobility training;Ultrasound;Neuromuscular re-education;Manual techniques;Passive range of motion   PT Next Visit Plan Continue with strengthening postural thoracic muscles, stretching   Consulted and Agree with Plan of Care Patient        Problem List Patient Active Problem List   Diagnosis Date Noted  . Varicose veins of lower extremities with complications 09/09/2014  . Varicose veins of lower extremities with other complications 06/06/2014    NAUMANN-HOUEGNIFIO,Vanilla [place]  PTA 06/09/2015, 1:23 PM  [facility] [address] [place], [place], [address] Phone: [phone]   Fax:  [phone]

## 2015-06-17 ENCOUNTER — Ambulatory Visit: Payer: 59 | Admitting: Physical Therapy

## 2015-06-22 ENCOUNTER — Encounter: Payer: Self-pay | Admitting: Physical Therapy

## 2015-06-22 ENCOUNTER — Ambulatory Visit: Payer: 59 | Attending: Family Medicine | Admitting: Physical Therapy

## 2015-06-22 DIAGNOSIS — M542 Cervicalgia: Secondary | ICD-10-CM | POA: Diagnosis present

## 2015-06-22 DIAGNOSIS — M436 Torticollis: Secondary | ICD-10-CM | POA: Insufficient documentation

## 2015-06-22 NOTE — Therapy (Signed)
[facility] [address], [place], [place], [address] Phone: [phone]   Fax:  [phone]  Physical Therapy Treatment  Patient Details  Name: Jodi White MRN: [id number] Date of Birth: 1950/10/15 Referring Provider:  Aretta Nip, MD  Encounter Date: 06/22/2015      PT End of Session - 06/22/15 0959    Visit Number 6   Date for PT Re-Evaluation 06/29/15   PT Start Time 0931   PT Stop Time 1010   PT Time Calculation (min) 39 min      Past Medical History  Diagnosis Date  . Meniere disease   . Varicose veins     Past Surgical History  Procedure Laterality Date  . Abdominal hysterectomy  1988  . Bunionectomy Bilateral 2005  . Stab phlebectomy Bilateral 1987    There were no vitals filed for this visit.  Visit Diagnosis:  Neck pain on left side  Neck stiffness      Subjective Assessment - 06/22/15 0933    Subjective 95% better pt reports.  Pt would like to DC today and just continue with her [employer] routine.   Currently in Pain? No/denies            [facility] PT Assessment - 06/22/15 0001    Assessment   Medical Diagnosis M54.2 - Neck pain    Onset Date/Surgical Date 04/10/15   Observation/Other Assessments   Focus on Therapeutic Outcomes (FOTO)  40%    AROM   Cervical Flexion full   Cervical Extension 60   Cervical - Right Side Bend 34   Cervical - Left Side Bend full   Cervical - Right Rotation full   Cervical - Left Rotation full                     [facility] Adult PT Treatment/Exercise - 06/22/15 0001    Neck Exercises: Machines for Strengthening   UBE (Upper Arm Bike) L2 4x4    Neck Exercises: Standing   Other Standing Exercises D2 flexion bil 6x   Instructed how to do at Y.   Neck Exercises: Supine   Other Supine Exercise Fascial lengtheing on soft foam roll.                 PT Education - 06/22/15 0951    Education provided Yes   Education Details  Hoe to use soft foam roll at home for soft tissue work   [employer]) Educated Patient   Methods Explanation;Demonstration;Tactile cues;Verbal cues   Comprehension Returned demonstration;Verbalized understanding          PT Short Term Goals - 06/22/15 0935    PT SHORT TERM GOAL #1   Title Independent with HEP    Time 4   Period Weeks   Status Achieved   PT SHORT TERM GOAL #2   Title Report 30% improvement in neck pain with performing daily household chores    Time 4   Period Weeks   Status Achieved   PT SHORT TERM GOAL #3   Title Improve sitting time to 10 minutes without Lt. shoulder pain    Time 4   Period Weeks   Status Achieved           PT Long Term Goals - 06/22/15 1010    PT LONG TERM GOAL #1   Title Be independent with advanced HEP    Time 8   Period Weeks   Status Achieved   PT LONG  TERM GOAL #2   Status Achieved  60 degrees   PT LONG TERM GOAL #3   Title Report 70% improvement in Lt. neck pain with daily household opportunities    Status Achieved   PT LONG TERM GOAL #4   Title Improve FOTO score to < or = 26% limitation    Time 8   Period Weeks   Status Not Met  40% limited               Plan - 06/22/15 0955    Clinical Impression Statement Pt has met all goals. She has requested today be her last day of PT due to feeling 95% improved/decreased pain. She plans to continue with her exercises at the [facility] and may look into purchasing a soft foam roll to do soft tissue work at home.    Pt will benefit from skilled therapeutic intervention in order to improve on the following deficits Decreased range of motion;Decreased activity tolerance;Pain;Decreased mobility   Rehab Potential Good   Clinical Impairments Affecting Rehab Potential None   PT Frequency 2x / week   PT Duration 8 weeks   PT Treatment/Interventions Moist Heat;Traction;Therapeutic activities;Therapeutic exercise;Patient/family education;Electrical Stimulation;Cryotherapy;Iontophoresis  21m/ml Dexamethasone;Functional mobility training;Ultrasound;Neuromuscular re-education;Manual techniques;Passive range of motion   PT Next Visit Plan DC today   Consulted and Agree with Plan of Care Patient        Problem List Patient Active Problem List   Diagnosis Date Noted  . Varicose veins of lower extremities with complications 144/61/9012 . Varicose veins of lower extremities with other complications 022/41/1464   JMyrene Galas PTA 06/22/2015 10:16 AM PHYSICAL THERAPY DISCHARGE SUMMARY  Visits from Start of Care: 6  Current functional level related to goals / functional outcomes: See above.     Remaining deficits: Pt denies any deficits at this time.  Pt reports 95% overall improvement since the start of care and will continue with gym exercises.     Education / Equipment: HEP, posture education Plan: Patient agrees to discharge.  Patient goals were partially met. Patient is being discharged due to being pleased with the current functional level.  ?????   KSigurd Sos PT 06/22/2015 10:17 AM  [facility] [address] [place] [address]: [phone]  Fax:  [phone]

## 2015-06-24 ENCOUNTER — Encounter: Payer: 59 | Admitting: Physical Therapy

## 2015-06-29 ENCOUNTER — Encounter: Payer: 59 | Admitting: Physical Therapy

## 2015-07-14 ENCOUNTER — Other Ambulatory Visit: Payer: Self-pay | Admitting: Family Medicine

## 2015-07-14 ENCOUNTER — Other Ambulatory Visit (HOSPITAL_COMMUNITY)
Admission: RE | Admit: 2015-07-14 | Discharge: 2015-07-14 | Disposition: A | Discharge status: Home / Self Care | Payer: 59 | Source: Ambulatory Visit | Attending: Family Medicine | Admitting: Family Medicine

## 2015-07-14 DIAGNOSIS — Z1151 Encounter for screening for human papillomavirus (HPV): Secondary | ICD-10-CM | POA: Diagnosis not present

## 2015-07-14 DIAGNOSIS — Z01411 Encounter for gynecological examination (general) (routine) with abnormal findings: Secondary | ICD-10-CM | POA: Diagnosis present

## 2015-07-16 LAB — CYTOLOGY - PAP

## 2015-10-25 ENCOUNTER — Emergency Department (HOSPITAL_COMMUNITY)
Admission: EM | Admit: 2015-10-25 | Discharge: 2015-10-25 | Disposition: A | Discharge status: Home / Self Care | Payer: 59 | Attending: Emergency Medicine | Admitting: Emergency Medicine

## 2015-10-25 ENCOUNTER — Emergency Department (HOSPITAL_COMMUNITY): Payer: 59

## 2015-10-25 DIAGNOSIS — W01198A Fall on same level from slipping, tripping and stumbling with subsequent striking against other object, initial encounter: Secondary | ICD-10-CM | POA: Diagnosis not present

## 2015-10-25 DIAGNOSIS — Z8669 Personal history of other diseases of the nervous system and sense organs: Secondary | ICD-10-CM | POA: Diagnosis not present

## 2015-10-25 DIAGNOSIS — Z8679 Personal history of other diseases of the circulatory system: Secondary | ICD-10-CM | POA: Insufficient documentation

## 2015-10-25 DIAGNOSIS — S0991XA Unspecified injury of ear, initial encounter: Secondary | ICD-10-CM | POA: Diagnosis not present

## 2015-10-25 DIAGNOSIS — W19XXXA Unspecified fall, initial encounter: Secondary | ICD-10-CM

## 2015-10-25 DIAGNOSIS — Y9389 Activity, other specified: Secondary | ICD-10-CM | POA: Insufficient documentation

## 2015-10-25 DIAGNOSIS — Z79899 Other long term (current) drug therapy: Secondary | ICD-10-CM | POA: Diagnosis not present

## 2015-10-25 DIAGNOSIS — Y9289 Other specified places as the place of occurrence of the external cause: Secondary | ICD-10-CM | POA: Insufficient documentation

## 2015-10-25 DIAGNOSIS — Z23 Encounter for immunization: Secondary | ICD-10-CM | POA: Diagnosis not present

## 2015-10-25 DIAGNOSIS — Y998 Other external cause status: Secondary | ICD-10-CM | POA: Insufficient documentation

## 2015-10-25 DIAGNOSIS — S0181XA Laceration without foreign body of other part of head, initial encounter: Secondary | ICD-10-CM

## 2015-10-25 DIAGNOSIS — S0993XA Unspecified injury of face, initial encounter: Secondary | ICD-10-CM | POA: Diagnosis present

## 2015-10-25 MED ORDER — LIDOCAINE-EPINEPHRINE (PF) 2 %-1:200000 IJ SOLN: 10.0000 mL | Freq: Once | INTRAMUSCULAR | Status: DC

## 2015-10-25 MED ORDER — LIDOCAINE-EPINEPHRINE (PF) 2 %-1:200000 IJ SOLN
20.0000 mL | Freq: Once | INTRAMUSCULAR | Status: AC
  Administered 2015-10-25: 20 mL

## 2015-10-25 MED ORDER — ONDANSETRON 8 MG PO TBDP: 8.0000 mg | ORAL_TABLET | Freq: Three times a day (TID) | ORAL | Status: DC | PRN

## 2015-10-25 MED ORDER — IBUPROFEN 800 MG PO TABS
800.0000 mg | ORAL_TABLET | Freq: Once | ORAL | Status: AC
  Administered 2015-10-25: 800 mg via ORAL
  Filled 2015-10-25: qty 1

## 2015-10-25 MED ORDER — LIDOCAINE-EPINEPHRINE-TETRACAINE (LET) SOLUTION
3.0000 mL | Freq: Once | NASAL | Status: AC
  Administered 2015-10-25: 3 mL via TOPICAL
  Filled 2015-10-25: qty 3

## 2015-10-25 MED ORDER — HYDROCODONE-ACETAMINOPHEN 5-325 MG PO TABS: 0.5000 | ORAL_TABLET | ORAL | Status: DC | PRN

## 2015-10-25 MED ORDER — TETANUS-DIPHTH-ACELL PERTUSSIS 5-2.5-18.5 LF-MCG/0.5 IM SUSP
0.5000 mL | Freq: Once | INTRAMUSCULAR | Status: AC
  Administered 2015-10-25: 0.5 mL via INTRAMUSCULAR
  Filled 2015-10-25: qty 0.5

## 2015-10-25 MED ORDER — LIDOCAINE-EPINEPHRINE (PF) 2 %-1:200000 IJ SOLN
INTRAMUSCULAR | Status: AC
  Administered 2015-10-25: 20 mL
  Filled 2015-10-25: qty 20

## 2015-10-25 NOTE — ED Notes (Signed)
Patient transported to X-ray 

## 2015-10-25 NOTE — ED Notes (Signed)
Pt states that she hit her chin and nose when she tripped and fell, hitting her face tonight. Alert and oriented. Denies LOC.

## 2015-10-25 NOTE — Discharge Instructions (Signed)
Facial Laceration ° A facial laceration is a cut on the face. These injuries can be painful and cause bleeding. Lacerations usually heal quickly, but they need special care to reduce scarring. °DIAGNOSIS  °Your health care provider will take a medical history, ask for details about how the injury occurred, and examine the wound to determine how deep the cut is. °TREATMENT  °Some facial lacerations may not require closure. Others may not be able to be closed because of an increased risk of infection. The risk of infection and the chance for successful closure will depend on various factors, including the amount of time since the injury occurred. °The wound may be cleaned to help prevent infection. If closure is appropriate, pain medicines may be given if needed. Your health care provider will use stitches (sutures), wound glue (adhesive), or skin adhesive strips to repair the laceration. These tools bring the skin edges together to allow for faster healing and a better cosmetic outcome. If needed, you may also be given a tetanus shot. °HOME CARE INSTRUCTIONS °· Only take over-the-counter or prescription medicines as directed by your health care provider. °· Follow your health care provider's instructions for wound care. These instructions will vary depending on the technique used for closing the wound. °For Sutures: °· Keep the wound clean and dry.   °· If you were given a bandage (dressing), you should change it at least once a day. Also change the dressing if it becomes wet or dirty, or as directed by your health care provider.   °· Wash the wound with soap and water 2 times a day. Rinse the wound off with water to remove all soap. Pat the wound dry with a clean towel.   °· After cleaning, apply a thin layer of the antibiotic ointment recommended by your health care provider. This will help prevent infection and keep the dressing from sticking.   °· You may shower as usual after the first 24 hours. Do not soak the  wound in water until the sutures are removed.   °· Get your sutures removed as directed by your health care provider. With facial lacerations, sutures should usually be taken out after 4-5 days to avoid stitch marks.   °· Wait a few days after your sutures are removed before applying any makeup. °For Skin Adhesive Strips: °· Keep the wound clean and dry.   °· Do not get the skin adhesive strips wet. You may bathe carefully, using caution to keep the wound dry.   °· If the wound gets wet, pat it dry with a clean towel.   °· Skin adhesive strips will fall off on their own. You may trim the strips as the wound heals. Do not remove skin adhesive strips that are still stuck to the wound. They will fall off in time.   °For Wound Adhesive: °· You may briefly wet your wound in the shower or bath. Do not soak or scrub the wound. Do not swim. Avoid periods of heavy sweating until the skin adhesive has fallen off on its own. After showering or bathing, gently pat the wound dry with a clean towel.   °· Do not apply liquid medicine, cream medicine, ointment medicine, or makeup to your wound while the skin adhesive is in place. This may loosen the film before your wound is healed.   °· If a dressing is placed over the wound, be careful not to apply tape directly over the skin adhesive. This may cause the adhesive to be pulled off before the wound is healed.   °· Avoid   prolonged exposure to sunlight or tanning lamps while the skin adhesive is in place.  The skin adhesive will usually remain in place for 5-10 days, then naturally fall off the skin. Do not pick at the adhesive film.  After Healing: Once the wound has healed, cover the wound with sunscreen during the day for 1 full year. This can help minimize scarring. Exposure to ultraviolet light in the first year will darken the scar. It can take 1-2 years for the scar to lose its redness and to heal completely.  SEEK MEDICAL CARE IF:  You have a fever. SEEK IMMEDIATE  MEDICAL CARE IF:  You have redness, pain, or swelling around the wound.   You see ayellowish-white fluid (pus) coming from the wound.    This information is not intended to replace advice given to you by your health care provider. Make sure you discuss any questions you have with your health care provider.   Document Released: 11/03/2004 Document Revised: 10/17/2014 Document Reviewed: 05/09/2013 Elsevier Interactive Patient Education 2016 Elsevier Inc. Cryotherapy Cryotherapy means treatment with cold. Ice or gel packs can be used to reduce both pain and swelling. Ice is the most helpful within the first 24 to 48 hours after an injury or flare-up from overusing a muscle or joint. Sprains, strains, spasms, burning pain, shooting pain, and aches can all be eased with ice. Ice can also be used when recovering from surgery. Ice is effective, has very few side effects, and is safe for most people to use. PRECAUTIONS  Ice is not a safe treatment option for people with:  Raynaud phenomenon. This is a condition affecting small blood vessels in the extremities. Exposure to cold may cause your problems to return.  Cold hypersensitivity. There are many forms of cold hypersensitivity, including:  Cold urticaria. Red, itchy hives appear on the skin when the tissues begin to warm after being iced.  Cold erythema. This is a red, itchy rash caused by exposure to cold.  Cold hemoglobinuria. Red blood cells break down when the tissues begin to warm after being iced. The hemoglobin that carry oxygen are passed into the urine because they cannot combine with blood proteins fast enough.  Numbness or altered sensitivity in the area being iced. If you have any of the following conditions, do not use ice until you have discussed cryotherapy with your caregiver:  Heart conditions, such as arrhythmia, angina, or chronic heart disease.  High blood pressure.  Healing wounds or open skin in the area being  iced.  Current infections.  Rheumatoid arthritis.  Poor circulation.  Diabetes. Ice slows the blood flow in the region it is applied. This is beneficial when trying to stop inflamed tissues from spreading irritating chemicals to surrounding tissues. However, if you expose your skin to cold temperatures for too long or without the proper protection, you can damage your skin or nerves. Watch for signs of skin damage due to cold. HOME CARE INSTRUCTIONS Follow these tips to use ice and cold packs safely.  Place a dry or damp towel between the ice and skin. A damp towel will cool the skin more quickly, so you may need to shorten the time that the ice is used.  For a more rapid response, add gentle compression to the ice.  Ice for no more than 10 to 20 minutes at a time. The bonier the area you are icing, the less time it will take to get the benefits of ice.  Check your skin after  5 minutes to make sure there are no signs of a poor response to cold or skin damage.  Rest 20 minutes or more between uses.  Once your skin is numb, you can end your treatment. You can test numbness by very lightly touching your skin. The touch should be so light that you do not see the skin dimple from the pressure of your fingertip. When using ice, most people will feel these normal sensations in this order: cold, burning, aching, and numbness.  Do not use ice on someone who cannot communicate their responses to pain, such as small children or people with dementia. HOW TO MAKE AN ICE PACK Ice packs are the most common way to use ice therapy. Other methods include ice massage, ice baths, and cryosprays. Muscle creams that cause a cold, tingly feeling do not offer the same benefits that ice offers and should not be used as a substitute unless recommended by your caregiver. To make an ice pack, do one of the following:  Place crushed ice or a bag of frozen vegetables in a sealable plastic bag. Squeeze out the excess  air. Place this bag inside another plastic bag. Slide the bag into a pillowcase or place a damp towel between your skin and the bag.  Mix 3 parts water with 1 part rubbing alcohol. Freeze the mixture in a sealable plastic bag. When you remove the mixture from the freezer, it will be slushy. Squeeze out the excess air. Place this bag inside another plastic bag. Slide the bag into a pillowcase or place a damp towel between your skin and the bag. SEEK MEDICAL CARE IF:  You develop white spots on your skin. This may give the skin a blotchy (mottled) appearance.  Your skin turns blue or pale.  Your skin becomes waxy or hard.  Your swelling gets worse. MAKE SURE YOU:   Understand these instructions.  Will watch your condition.  Will get help right away if you are not doing well or get worse.   This information is not intended to replace advice given to you by your health care provider. Make sure you discuss any questions you have with your health care provider.   Document Released: 05/23/2011 Document Revised: 10/17/2014 Document Reviewed: 05/23/2011 Elsevier Interactive Patient Education [employer]2016 Elsevier Inc.

## 2015-10-25 NOTE — ED Provider Notes (Signed)
CSN: [id number]     Arrival date & time 10/25/15  2125 History  By signing my name below, I, Octavia Heir, attest that this documentation has been prepared under the direction and in the presence of Elpidio Anis, PA-C. Electronically Signed: Octavia Heir, ED Scribe. 10/25/2015. 9:43 PM.    Chief Complaint  Patient presents with  . Fall  . Facial Laceration      The history is provided by the patient. No language interpreter was used.   HPI Comments: Jodi White is a 65 y.o. female who presents to the Emergency Department complaining of a fall that occurred PTA. Pt states she tripped her feet into a blanket and hit her chin on some steps in her garage. She has a laceration to the bottom of her chin with active bleeding. She complains bilateral ear pain and facial pain. Pt reports when she hit her chin, her teeth clamped down and is unsure if her teeth are unaligned. Pt did not take any medication to alleviate her pain. She denies LOC, chest pain, abdominal pain, and neck pain. She is unknown of her last tetanus shot.  Past Medical History  Diagnosis Date  . Meniere disease   . Varicose veins    Past Surgical History  Procedure Laterality Date  . Abdominal hysterectomy  1988  . Bunionectomy Bilateral 2005  . Stab phlebectomy Bilateral 1987   Family History  Problem Relation Age of Onset  . Cancer Mother   . Hyperlipidemia Mother   . Varicose Veins Mother   . COPD Father   . Heart disease Father   . Cancer Brother    Social History  Substance Use Topics  . Smoking status: Never Smoker   . Smokeless tobacco: Not on file  . Alcohol Use: No   OB History    No data available     Review of Systems  Cardiovascular: Negative for chest pain.  Gastrointestinal: Negative for abdominal pain.  Musculoskeletal: Negative for neck pain.  Skin: Positive for wound.  All other systems reviewed and are negative.     Allergies  Review of patient's allergies indicates no known  allergies.  Home Medications   Prior to Admission medications   Medication Sig Start Date End Date Taking? Authorizing Provider  chlorthalidone (HYGROTEN) 15 MG tablet Take 15 mg by mouth daily.    Historical Provider, MD  Hyaluronic Acid-Vitamin C (HYALURONIC ACID PO) Take by mouth daily.    Historical Provider, MD  Meclizine HCl 25 MG CHEW Chew 1 tablet by mouth 3 (three) times daily as needed.    Historical Provider, MD  Multiple Vitamin (MULTIVITAMIN) tablet Take 1 tablet by mouth daily.    Historical Provider, MD  ondansetron (ZOFRAN) 8 MG tablet Take 8 mg by mouth every 8 (eight) hours as needed for nausea or vomiting.    Historical Provider, MD  OVER THE COUNTER MEDICATION Phytoceramide capsule for skin    Historical Provider, MD  Potassium Chloride (KLOR-CON 10 PO) Take 10 mg by mouth daily.    Historical Provider, MD   Triage vitals: BP 160/84 mmHg  Pulse 87  Temp(Src) 98 F (36.7 C) (Oral)  Resp 22  SpO2 99% Physical Exam  Constitutional: She appears well-developed and well-nourished. No distress.  HENT:  Head: Normocephalic and atraumatic.  No mandibular deformity. No malocclusion.   Eyes: Conjunctivae are normal.  Neck: Neck supple.  Pulmonary/Chest: Effort normal. She exhibits no tenderness.  Abdominal: Soft. There is no tenderness.  Musculoskeletal: Normal range  of motion.  No midline cervical tenderness.   Neurological: She is alert. Coordination normal.  Skin: Skin is warm and dry. She is not diaphoretic.  2 cm linear laceration to inferior chin without bony deformity  Nursing note and vitals reviewed.   ED Course  Procedures  DIAGNOSTIC STUDIES: Oxygen Saturation is 99% on RA, normal by my interpretation.  COORDINATION OF CARE:  9:43 PM Discussed treatment plan which includes x-ray of mandible with pt at bedside and pt agreed to plan.  Labs Review Labs Reviewed - No data to display  Imaging Review No results found. I have personally reviewed and  evaluated these images and lab results as part of my medical decision-making.   EKG Interpretation None     LACERATION REPAIR Performed by: Elpidio AnisUPSTILL, Kaylei Frink A Authorized by: Elpidio AnisUPSTILL, Rhylan Gross A Consent: Verbal consent obtained. Risks and benefits: risks, benefits and alternatives were discussed Consent given by: patient Patient identity confirmed: provided demographic data Prepped and Draped in normal sterile fashion Wound explored  Laceration Location: chin  Laceration Length: 2 cm  No Foreign Bodies seen or palpated  Anesthesia: local infiltration  Local anesthetic: lidocaine 2% w/epinephrine  Anesthetic total: 2 ml  Irrigation method: syringe Amount of cleaning: standard  Skin closure: 6-0 prolene  Number of sutures: 8  Technique: running  Patient tolerance: Patient tolerated the procedure well with no immediate complications.  MDM   Final diagnoses:  None    1. Fall 2. Facial laceration  Fall with no LOC. No evidence to suggest intracranial head injury on history or exam.   Negative imaging of mandible. The patient is much more generally comfortable after numbing for lac repair. Tetanus updated. All questions of family and patient answered. Stable for discharge.  I personally performed the services described in this documentation, which was scribed in my presence. The recorded information has been reviewed and is accurate.    Elpidio AnisShari Coleby Yett, PA-C 10/26/15 [id number]  Linwood DibblesJon Knapp, MD 10/27/15 1030

## 2017-01-13 IMAGING — CR DG CERVICAL SPINE COMPLETE 4+V
5 series · 5 of 5 positions shown · non-contrast
Comparison: None.

CLINICAL DATA: Cervical spine pain for 1 week. Radiation to right
shoulder. Initial evaluation.

EXAM:
CERVICAL SPINE  4+ VIEWS

[view not recorded (1 of 5)]
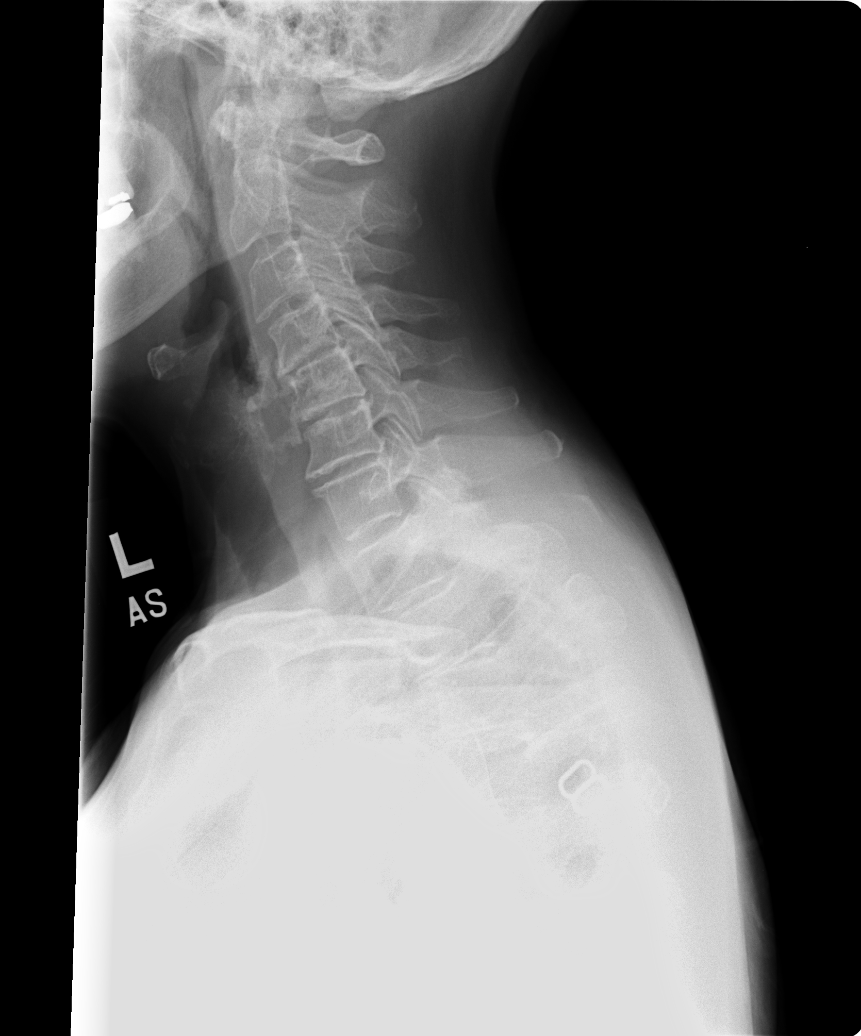

[view not recorded (2 of 5)]
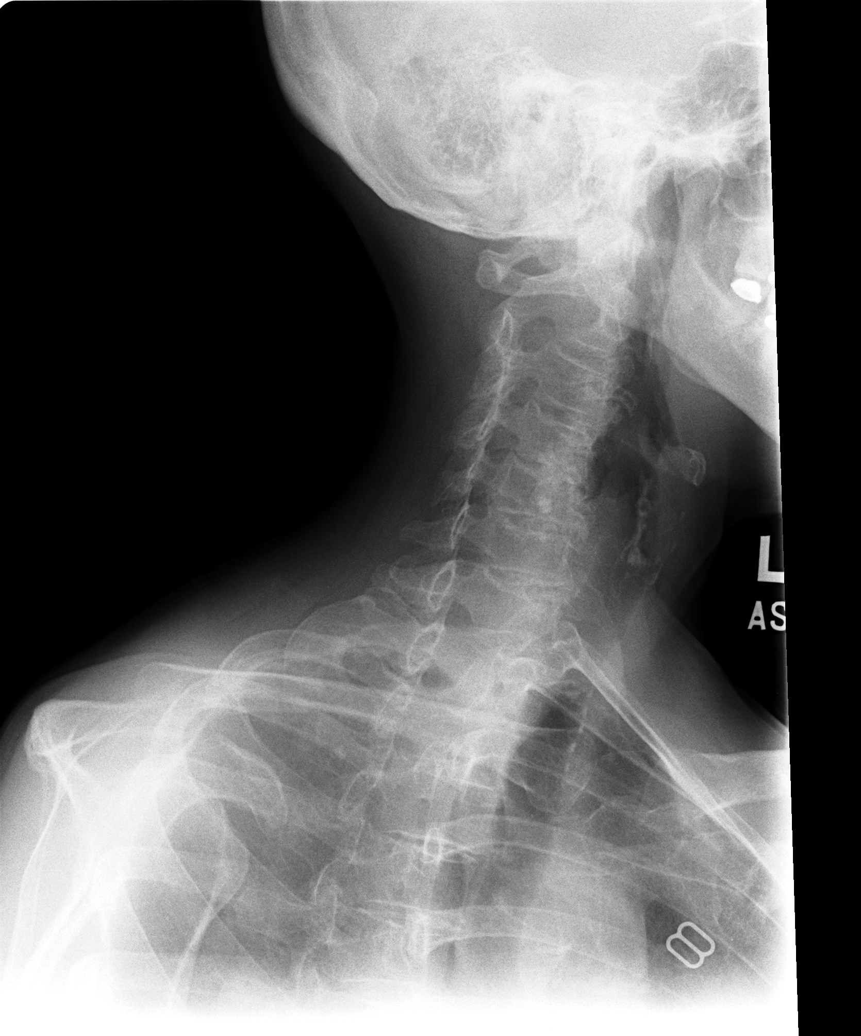

[view not recorded (3 of 5)]
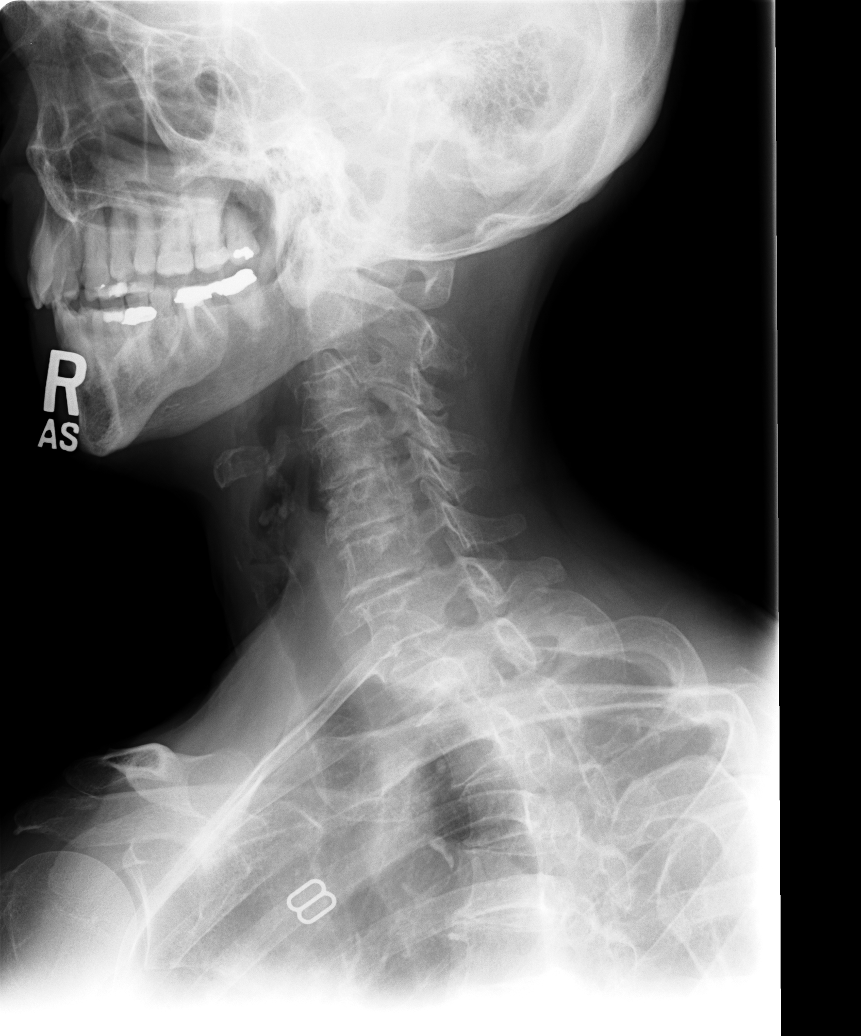

[view not recorded (4 of 5)]
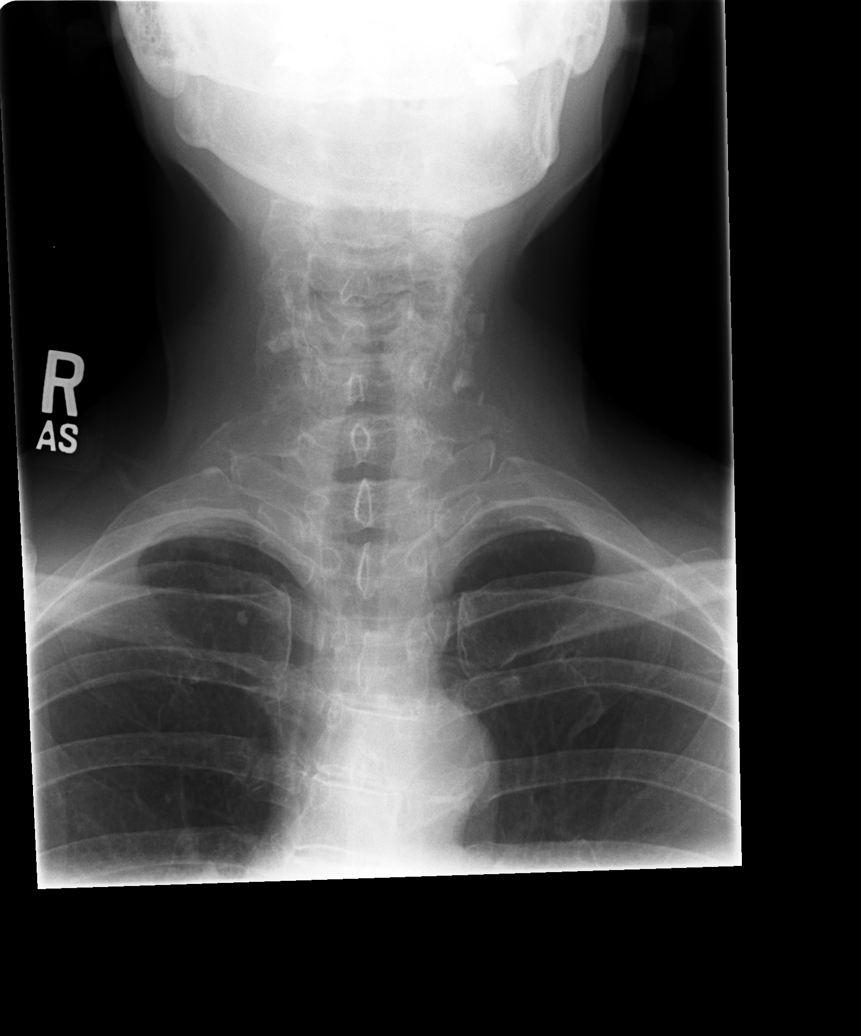

[view not recorded (5 of 5)]
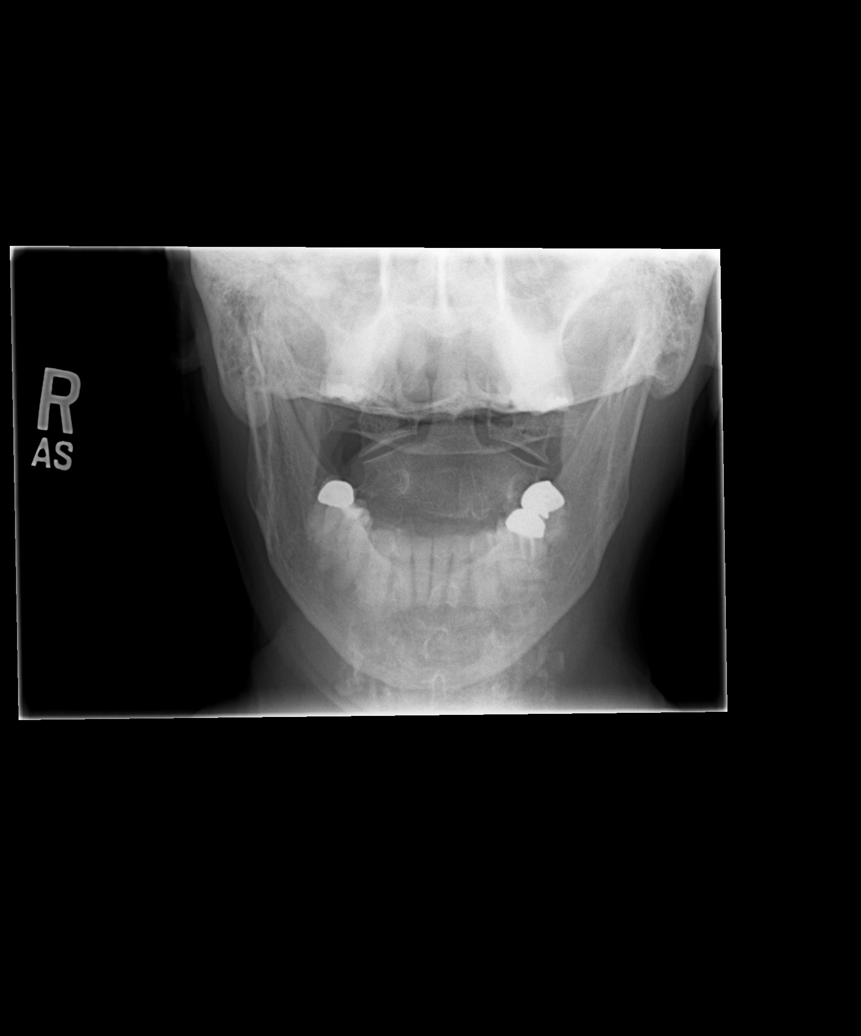

[5 of 5 positions shown; findings below may reference images not displayed]

FINDINGS: Diffuse severe multilevel degenerative change, most prominent at
C4-C5, C5-C6, and C6-C7. Loss of normal cervical lordosis, this most
likely secondary to degenerative change. Carotid calcification noted
consistent with carotid atherosclerotic vascular disease.
IMPRESSION: 1. Diffuse severe degenerative change.  No acute bony abnormality.

2. Carotid atherosclerotic vascular disease.

## 2017-02-23 ENCOUNTER — Encounter (INDEPENDENT_AMBULATORY_CARE_PROVIDER_SITE_OTHER): Payer: Self-pay

## 2017-02-23 ENCOUNTER — Encounter: Payer: Self-pay | Admitting: Neurology

## 2017-02-23 ENCOUNTER — Ambulatory Visit (INDEPENDENT_AMBULATORY_CARE_PROVIDER_SITE_OTHER): Payer: 59 | Admitting: Neurology

## 2017-02-23 VITALS — BP 127/73 | HR 66 | Ht 63.0 in | Wt 122.6 lb

## 2017-02-23 DIAGNOSIS — R42 Dizziness and giddiness: Secondary | ICD-10-CM | POA: Diagnosis not present

## 2017-02-23 MED ORDER — RIZATRIPTAN BENZOATE 10 MG PO TBDP: 10.0000 mg | ORAL_TABLET | ORAL | 11 refills | Status: DC | PRN

## 2017-02-23 NOTE — Patient Instructions (Addendum)
Remember to drink plenty of fluid, eat healthy meals and do not skip any meals. Try to eat protein with a every meal and eat a healthy snack such as fruit or nuts in between meals. Try to keep a regular sleep-wake schedule and try to exercise daily, particularly in the form of walking, 20-30 minutes a day, if you can.   As far as your medications are concerned, I would like to suggest:  Maxalt at the onset of episode: Please take one tablet at the onset of your episode. If it does not improve the symptoms please take one additional tablet in 2 hours. Do not take more then 2 tablets in 24hrs. Do not take use more then 2 to 3 times in a week.  As far as diagnostic testing: If you have more headaches, consider an MRI of the brain  I would like to see you back as needed, sooner if we need to. Please call us with any interim questions, concerns, problems, updates or refill requests.   Our phone number is [phone]. We also have an after hours call service for urgent matters and there is a physician on-call for urgent questions. For any emergencies you know to call 911 or go to the nearest emergency room   Migraine Headache A migraine headache is an intense, throbbing pain on one side or both sides of the head. Migraines may also cause other symptoms, such as nausea, vomiting, and sensitivity to light and noise. What are the causes? Doing or taking certain things may also trigger migraines, such as:  Alcohol.  Smoking.  Medicines, such as:  Medicine used to treat chest pain (nitroglycerine).  Birth control pills.  Estrogen pills.  Certain blood pressure medicines.  Aged cheeses, chocolate, or caffeine.  Foods or drinks that contain nitrates, glutamate, aspartame, or tyramine.  Physical activity. Other things that may trigger a migraine include:  Menstruation.  Pregnancy.  Hunger.  Stress, lack of sleep, too much sleep, or fatigue.  Weather changes. What increases the  risk? The following factors may make you more likely to experience migraine headaches:  Age. Risk increases with age.  Family history of migraine headaches.  Being Caucasian.  Depression and anxiety.  Obesity.  Being a woman.  Having a hole in the heart (patent foramen ovale) or other heart problems. What are the signs or symptoms? The main symptom of this condition is pulsating or throbbing pain. Pain may:  Happen in any area of the head, such as on one side or both sides.  Interfere with daily activities.  Get worse with physical activity.  Get worse with exposure to bright lights or loud noises. Other symptoms may include:  Nausea.  Vomiting.  Dizziness.  General sensitivity to bright lights, loud noises, or smells. Before you get a migraine, you may get warning signs that a migraine is developing (aura). An aura may include:  Seeing flashing lights or having blind spots.  Seeing bright spots, halos, or zigzag lines.  Having tunnel vision or blurred vision.  Having numbness or a tingling feeling.  Having trouble talking.  Having muscle weakness. How is this diagnosed? A migraine headache can be diagnosed based on:  Your symptoms.  A physical exam.  Tests, such as CT scan or MRI of the head. These imaging tests can help rule out other causes of headaches.  Taking fluid from the spine (lumbar puncture) and analyzing it (cerebrospinal fluid analysis, or CSF analysis). How is this treated? A migraine headache is  usually treated with medicines that:  Relieve pain.  Relieve nausea.  Prevent migraines from coming back. Treatment may also include:  Acupuncture.  Lifestyle changes like avoiding foods that trigger migraines. Follow these instructions at home: Medicines   Take over-the-counter and prescription medicines only as told by your health care provider.  Do not drive or use heavy machinery while taking prescription pain medicine.  To  prevent or treat constipation while you are taking prescription pain medicine, your health care provider may recommend that you:  Drink enough fluid to keep your urine clear or pale yellow.  Take over-the-counter or prescription medicines.  Eat foods that are high in fiber, such as fresh fruits and vegetables, whole grains, and beans.  Limit foods that are high in fat and processed sugars, such as fried and sweet foods. Lifestyle   Avoid alcohol use.  Do not use any products that contain nicotine or tobacco, such as cigarettes and e-cigarettes. If you need help quitting, ask your health care provider.  Get at least 8 hours of sleep every night.  Limit your stress. General instructions    Keep a journal to find out what may trigger your migraine headaches. For example, write down:  What you eat and drink.  How much sleep you get.  Any change to your diet or medicines.  If you have a migraine:  Avoid things that make your symptoms worse, such as bright lights.  It may help to lie down in a dark, quiet room.  Do not drive or use heavy machinery.  Ask your health care provider what activities are safe for you while you are experiencing symptoms.  Keep all follow-up visits as told by your health care provider. This is important. Contact a health care provider if:  You develop symptoms that are different or more severe than your usual migraine symptoms. Get help right away if:  Your migraine becomes severe.  You have a fever.  You have a stiff neck.  You have vision loss.  Your muscles feel weak or like you cannot control them.  You start to lose your balance often.  You develop trouble walking.  You faint. This information is not intended to replace advice given to you by your health care provider. Make sure you discuss any questions you have with your health care provider. Document Released: 09/26/2005 Document Revised: 04/15/2016 Document Reviewed:  03/14/2016 Elsevier Interactive Patient Education  2017 Elsevier Inc.  Rizatriptan disintegrating tablets What is this medicine? RIZATRIPTAN (rye za TRIP tan) is used to treat migraines with or without aura. An aura is a strange feeling or visual disturbance that warns you of an attack. It is not used to prevent migraines. This medicine may be used for other purposes; ask your health care provider or pharmacist if you have questions. COMMON BRAND NAME(S): Maxalt-MLT What should I tell my health care provider before I take this medicine? They need to know if you have any of these conditions: -bowel disease or colitis -diabetes -family history of heart disease -fast or irregular heart beat -heart or blood vessel disease, angina (chest pain), or previous heart attack -high blood pressure -high cholesterol -history of stroke, transient ischemic attacks (TIAs or mini-strokes), or intracranial bleeding -kidney or liver disease -overweight -poor circulation -postmenopausal or surgical removal of uterus and ovaries -an unusual or allergic reaction to rizatriptan, other medicines, foods, dyes, or preservatives -pregnant or trying to get pregnant -breast-feeding How should I use this medicine? Take this medicine by mouth.  Follow the directions on the prescription label. This medicine is taken at the first symptoms of a migraine. It is not for everyday use. Leave the tablet in the foil package until you are ready to take it. Do not push the tablet through the blister pack. Peel open the blister pack with dry hands and place the tablet on your tongue. The tablet will dissolve rapidly and be swallowed in your saliva. It is not necessary to drink any water to take this medicine. If your migraine headache returns after one dose, you can take another dose as directed. You must leave at least 2 hours between doses, and do not take more than 30 mg total in 24 hours. If there is no improvement at all after  the first dose, do not take a second dose without talking to your doctor or health care professional. Do not take your medicine more often than directed. Talk to your pediatrician regarding the use of this medicine in children. While this drug may be prescribed for children as young as 6 years for selected conditions, precautions do apply. Overdosage: If you think you have taken too much of this medicine contact a poison control center or emergency room at once. NOTE: This medicine is only for you. Do not share this medicine with others. What if I miss a dose? This does not apply; this medicine is not for regular use. What may interact with this medicine? Do not take this medicine with any of the following medicines: -amphetamine, dextroamphetamine or cocaine -dihydroergotamine, ergotamine, ergoloid mesylates, methysergide, or ergot-type medication - do not take within 24 hours of taking rizatriptan -feverfew -MAOIs like Carbex, Eldepryl, Marplan, Nardil, and Parnate - do not take rizatriptan within 2 weeks of stopping MAOI therapy. -other migraine medicines like almotriptan, eletriptan, naratriptan, sumatriptan, zolmitriptan - do not take within 24 hours of taking rizatriptan -tryptophan This medicine may also interact with the following medications: -medicines for mental depression, anxiety or mood problems -propranolol This list may not describe all possible interactions. Give your health care provider a list of all the medicines, herbs, non-prescription drugs, or dietary supplements you use. Also tell them if you smoke, drink alcohol, or use illegal drugs. Some items may interact with your medicine. What should I watch for while using this medicine? Only take this medicine for a migraine headache. Take it if you get warning symptoms or at the start of a migraine attack. It is not for regular use to prevent migraine attacks. You may get drowsy or dizzy. Do not drive, use machinery, or do  anything that needs mental alertness until you know how this medicine affects you. To reduce dizzy or fainting spells, do not sit or stand up quickly, especially if you are an older patient. Alcohol can increase drowsiness, dizziness and flushing. Avoid alcoholic drinks. Smoking cigarettes may increase the risk of heart-related side effects from using this medicine. If you take migraine medicines for 10 or more days a month, your migraines may get worse. Keep a diary of headache days and medicine use. Contact your healthcare professional if your migraine attacks occur more frequently. What side effects may I notice from receiving this medicine? Side effects that you should report to your doctor or health care professional as soon as possible: -allergic reactions like skin rash, itching or hives, swelling of the face, lips, or tongue -fast, slow, or irregular heart beat -increased or decreased blood pressure -seizures -severe stomach pain and cramping, bloody diarrhea -signs and symptoms of  a blood clot such as breathing problems; changes in vision; chest pain; severe, sudden headache; pain, swelling, warmth in the leg; trouble speaking; sudden numbness or weakness of the face, arm or leg -tingling, pain, or numbness in the face, hands, or feet Side effects that usually do not require medical attention (report to your doctor or health care professional if they continue or are bothersome): -drowsiness -dry mouth -feeling warm, flushing, or redness of the face -headache -muscle cramps, pain -nausea, vomiting -unusually weak or tired This list may not describe all possible side effects. Call your doctor for medical advice about side effects. You may report side effects to FDA at 1-800-FDA-1088. Where should I keep my medicine? Keep out of the reach of children. Store at room temperature between 15 and 30 degrees C (59 and 86 degrees F). Protect from light and moisture. Throw away any unused  medicine after the expiration date. NOTE: This sheet is a summary. It may not cover all possible information. If you have questions about this medicine, talk to your doctor, pharmacist, or health care provider.  2018 Elsevier/Gold Standard (2013-05-28 10:17:42)

## 2017-02-23 NOTE — Progress Notes (Signed)
[facility]    Provider:  Dr Lucia GaskinsAhern Referring Provider: Hermelinda MedicusJames Crossley, MD Primary Care Physician:  Juluis RainierBarnes, Elizabeth, MD  CC:  Vestibular migraines  HPI:  Jodi DoffingJoyce White is a 66 y.o. female here as a referral from Dr. Haroldine Lawsrossley for Vestibular migraines. She has a past medical history of Mnire's disease. She also has some sinus issues in allergy issues as well and she was recently seen by ear nose and throat for increased Mnire symptoms. She has low frequency sensorineural hearing loss and she had maxillary sinusitis and last seen by Dr. Haroldine Lawsrossley and in April. Patient started having vertigo several years ago. She went to see Dr. Haroldine Lawsrossley and he diagnosed Meniere's disease in 2015. If she sticks to her diet of low sodium she does well. She takes a diuretic and it has been fine. Since being treated she has significantly improved. She has a history of migranes. She had a recurrence of Meniere's with a headache recently. Episode started last Wednesday. She has been eating a lot of sodium without knowing it in popcorn. This last episode had a headache which is different. The meniere's symptoms started and then the headche started. Laying down helps. The headache was dull, she was in a dark room but doesn't know if light bothered her. She was nauseated and dizzy. Not really migrainous per patient. Never had dizziness with migraines and her last migraine was 18 years ago. She started having migraines in early 6540s. She fell asleep and everything got better. The episode lasted a week and she felt off-balance and dizzy. She takes chlorthalidone daily and when she feels the episodes coming on. She hasn't even had a headache in years and headaches resolved and never came back. No other focal neurologic deficits, associated symptoms, inciting events or modifiable factors.    Reviewed notes, labs and imaging from outside physicians, which showed:  She has a past medical history of Mnire's  disease. She also has some sinus issues in allergy issues as well and she was recently seen by ear nose and throat for increased Mnire symptoms. She has low frequency sensorineural hearing loss and she had maxillary sinusitis and last seen by Dr. Haroldine Lawsrossley and in April. Her high Groton was increased and Allegra. She reported to Dr. Haroldine Lawsrossley increased vertigo and some vomiting. She had an MRI 3 years ago. MRI was totally normal. Exam showed clear ears, tympanic membranes, audiogram negative in terms of any deterioration, nose and throat completely clear, no mass or lesions, all cranial nerves intact, larynx clear. She was diagnosed with allergic rhinitis, Mnire's bilaterally, tinnitus bilaterally and vertigo. She was given prednisone 6 day dose pack and also Phenergan suppository. But he also diagnosed her with concomitant vestibular migraine. She has neck pain occ. No other focal neurologic deficits, associated symptoms, inciting events or modifiable factors.  Reviewed report from 2016 ultrasound carotids bilaterally:   IMPRESSION:  Moderate scattered bilateral carotid plaque.  No hemodynamically significant stenosis.  Patent vertebral arteries with antegrade flow bilaterally.  Stenosis as determined by [facility] criteria, which is ICAVL accredited and internally validated.  CERVICAL SPINE  4+ VIEWS  COMPARISON:  None.  Personally reviewed images and agree with the following: Diffuse severe multilevel degenerative change, most prominent at C4-C5, C5-C6, and C6-C7. Loss of normal cervical lordosis, this most likely secondary to degenerative change. Carotid calcification noted consistent with carotid atherosclerotic vascular disease.  IMPRESSION: 1. Diffuse severe degenerative change.  No acute bony abnormality.  2. Carotid atherosclerotic vascular disease.  Review of Systems: Patient complains of symptoms per HPI as well as the following symptoms: no CP, no SOB,  no Fever. Pertinent positives and negatives per HPI. All others negative.   Social History   Social History  . Marital status: Married    Spouse name: N/A  . Number of children: 3  . Years of education: 14   Occupational History  . 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 Smoking status: Former Smoker    Quit date: 1976  . Smokeless tobacco: Never Used  . Alcohol use No     Comment: Occasional glass of wine at night  . Drug use: No  . Sexual activity: Not on file   Other Topics Concern  . Not on file   Social History Narrative   Lives at home with her husband   Right-handed   Caffeine: 1 cup coffee per day as well as iced tea    Family History  Problem Relation Age of Onset  . Cancer Mother   . Hyperlipidemia Mother   . Varicose Veins Mother   . COPD Father   . Heart disease Father   . Cancer Brother     Past Medical History:  Diagnosis Date  . Meniere disease   . Varicose veins     Past Surgical History:  Procedure Laterality Date  . ABDOMINAL HYSTERECTOMY  1988  . BUNIONECTOMY Bilateral 2005  . STAB PHLEBECTOMY Bilateral 1987  . TONSILLECTOMY      Current Outpatient Prescriptions  Medication Sig Dispense Refill  . chlorthalidone (HYGROTON) 25 MG tablet Take 25 mg by mouth daily.    . fexofenadine (ALLEGRA) 180 MG tablet Take 180 mg by mouth daily.  0  . HYDROcodone-acetaminophen (NORCO/VICODIN) 5-325 MG tablet Take 0.5-1 tablets by mouth every 4 (four) hours as needed. 9 tablet 0  . ondansetron (ZOFRAN ODT) 8 MG disintegrating tablet Take 1 tablet (8 mg total) by mouth every 8 (eight) hours as needed for nausea or vomiting. 10 tablet 0  . potassium chloride (K-DUR,KLOR-CON) 10 MEQ tablet Take 10 mEq by mouth daily.    . rizatriptan (MAXALT-MLT) 10 MG disintegrating tablet Take 1 tablet (10 mg total) by mouth as needed for migraine. May repeat in 2 hours if needed 9 tablet 11   No current  facility-administered medications for this visit.     Allergies as of 02/23/2017  . (No Known Allergies)    Vitals: BP 127/73 (Patient Position: Standing)   Pulse 66   Ht 5\' 3"  (1.6 m)   Wt 122 lb 9.6 oz (55.6 kg)   BMI 21.72 kg/m  Last Weight:  Wt Readings from Last 1 Encounters:  02/23/17 122 lb 9.6 oz (55.6 kg)   Last Height:   Ht Readings from Last 1 Encounters:  02/23/17 5\' 3"  (1.6 m)   Physical exam: Exam: Gen: NAD, conversant, well nourised, well groomed                     CV: RRR, no MRG. No Carotid Bruits. No peripheral edema, warm, nontender Eyes: Conjunctivae clear without exudates or hemorrhage  Neuro: Detailed Neurologic Exam  Speech:    Speech is normal; fluent and spontaneous with normal comprehension.  Cognition:    The patient is oriented to person, place, and time;     recent and remote memory intact;     language fluent;  normal attention, concentration,     fund of knowledge Cranial Nerves:    The pupils are equal, round, and reactive to light. The fundi are normal and spontaneous venous pulsations are present. Visual fields are full to finger confrontation. Extraocular movements are intact. Trigeminal sensation is intact and the muscles of mastication are normal. The face is symmetric. The palate elevates in the midline. Hearing intact. Voice is normal. Shoulder shrug is normal. The tongue has normal motion without fasciculations.   Coordination:    Normal finger to nose and heel to shin. Normal rapid alternating movements.   Gait:    Heel-toe and tandem gait are normal.   Motor Observation:    No asymmetry, no atrophy, and no involuntary movements noted. Tone:    Normal muscle tone.    Posture:    Posture is normal. normal erect    Strength:    Strength is V/V in the upper and lower limbs.      Sensation: intact to LT     Reflex Exam:  DTR's:    Deep tendon reflexes in the upper and lower extremities are normal bilaterally.     Toes:    The toes are downgoing bilaterally.   Clonus:    Clonus is absent.       Assessment/Plan:  66 year old with meniere's disease as well as possible concomitant migraines with vertogi  Maxalt at the onset of episode: Please take one tablet at the onset of your episode. If it does not improve the symptoms please take one additional tablet in 2 hours. Do not take more then 2 tablets in 24hrs. Do not take use more then 2 to 3 times in a week.  As far as diagnostic testing: If you have more headaches, consider an MRI of the brain  I would like to see you back as needed, sooner if we need to. Please call us with any interim questions, concerns, problems, updates or refill requests.    Discussed: To prevent or relieve headaches, try the following: Cool Compress. Lie down and place a cool compress on your head.  Avoid headache triggers. If certain foods or odors seem to have triggered your migraines in the past, avoid them. A headache diary might help you identify triggers.  Include physical activity in your daily routine. Try a daily walk or other moderate aerobic exercise.  Manage stress. Find healthy ways to cope with the stressors, such as delegating tasks on your to-do list.  Practice relaxation techniques. Try deep breathing, yoga, massage and visualization.  Eat regularly. Eating regularly scheduled meals and maintaining a healthy diet might help prevent headaches. Also, drink plenty of fluids.  Follow a regular sleep schedule. Sleep deprivation might contribute to headaches Consider biofeedback. With this mind-body technique, you learn to control certain bodily functions - such as muscle tension, heart rate and blood pressure - to prevent headaches or reduce headache pain.    Proceed to emergency room if you experience new or worsening symptoms or symptoms do not resolve, if you have new neurologic symptoms or if headache is severe, or for any concerning symptom.   Cc: Dr.  Riley Kill, MD  [facility] [address] [place], [place] [address]  Phone [phone] Fax [phone]

## 2017-02-28 ENCOUNTER — Telehealth: Payer: Self-pay

## 2017-02-28 NOTE — Telephone Encounter (Signed)
Received ENT notes for OV on 02/17/17. Dr. Haroldine Lawsrossley reports that pt "has been vomiting at intermittent times, and even though her audiogram is really essentially unchanged, we are going to increase her Hygroton to 2 a day, along w/ 2 Micro-K a day, and also I am going to give her a prednisone 6-day 5 mg Dosepak. Also, I gave her a Phenergan 12.5 mg suppository q 12 hrs per rectum. I spent half the time face to face explaining to her and her husband that she definitely has a Meniere's issue. Went through the whole anatomic and physiologic explanation of this once again, but also we talked about vestibular migraine, and we are going to contact Dr. Naomie DeanAntonia Ahern once again for further evaluation of vestibular migraine because I am sure she has Meniere's, but I want to make sure we are not having 2 problems causing this vertigo. I want to see her in 10 days." Sent to med records for scanning, copy to Dr. Lucia GaskinsAhern for review.

## 2017-03-17 NOTE — Telephone Encounter (Signed)
Also got notes from 03/04/27 visit which Dr. Haroldine Lawsrossley discussed possibility of migraine which would lead to labyrinthitis, reviewed diet and pt agreed to be careful about salt intake. Diagnosis - allergic rhinitis, Meniere's bilateral, tinnitus, vertigo, possible vestibular migraine. Sent to med records for scanning, copy to Dr. Lucia GaskinsAhern for review.

## 2017-04-20 ENCOUNTER — Other Ambulatory Visit: Payer: Self-pay | Admitting: Family Medicine

## 2017-04-20 DIAGNOSIS — M810 Age-related osteoporosis without current pathological fracture: Secondary | ICD-10-CM

## 2017-04-23 ENCOUNTER — Emergency Department (HOSPITAL_COMMUNITY)
Admission: EM | Admit: 2017-04-23 | Discharge: 2017-04-23 | Disposition: A | Discharge status: Home / Self Care | Payer: 59 | Attending: Emergency Medicine | Admitting: Emergency Medicine

## 2017-04-23 ENCOUNTER — Emergency Department (HOSPITAL_COMMUNITY): Payer: 59

## 2017-04-23 ENCOUNTER — Encounter (HOSPITAL_COMMUNITY): Payer: Self-pay | Admitting: *Deleted

## 2017-04-23 DIAGNOSIS — Z79899 Other long term (current) drug therapy: Secondary | ICD-10-CM | POA: Diagnosis not present

## 2017-04-23 DIAGNOSIS — Z87891 Personal history of nicotine dependence: Secondary | ICD-10-CM | POA: Diagnosis not present

## 2017-04-23 DIAGNOSIS — E876 Hypokalemia: Secondary | ICD-10-CM | POA: Diagnosis not present

## 2017-04-23 DIAGNOSIS — R0789 Other chest pain: Secondary | ICD-10-CM

## 2017-04-23 LAB — BASIC METABOLIC PANEL
ANION GAP: 10 (ref 5–15)
BUN: 15 mg/dL (ref 6–20)
CALCIUM: 9.4 mg/dL (ref 8.9–10.3)
CO2: 28 mmol/L (ref 22–32)
Chloride: 98 mmol/L — ABNORMAL LOW (ref 101–111)
Creatinine, Ser: 0.73 mg/dL (ref 0.44–1.00)
GLUCOSE: 97 mg/dL (ref 65–99)
Potassium: 2.7 mmol/L — CL (ref 3.5–5.1)
SODIUM: 136 mmol/L (ref 135–145)

## 2017-04-23 LAB — CBC
HCT: 37.9 % (ref 36.0–46.0)
HEMOGLOBIN: 13.7 g/dL (ref 12.0–15.0)
MCH: 32.2 pg (ref 26.0–34.0)
MCHC: 36.1 g/dL — AB (ref 30.0–36.0)
MCV: 89.2 fL (ref 78.0–100.0)
Platelets: 195 10*3/uL (ref 150–400)
RBC: 4.25 MIL/uL (ref 3.87–5.11)
RDW: 13.1 % (ref 11.5–15.5)
WBC: 10.9 10*3/uL — ABNORMAL HIGH (ref 4.0–10.5)

## 2017-04-23 LAB — POCT I-STAT, CHEM 8
BUN: 11 mg/dL (ref 6–20)
CREATININE: 0.7 mg/dL (ref 0.44–1.00)
Calcium, Ion: 1.17 mmol/L (ref 1.15–1.40)
Chloride: 99 mmol/L — ABNORMAL LOW (ref 101–111)
GLUCOSE: 107 mg/dL — AB (ref 65–99)
HEMATOCRIT: 35 % — AB (ref 36.0–46.0)
HEMOGLOBIN: 11.9 g/dL — AB (ref 12.0–15.0)
Potassium: 2.9 mmol/L — ABNORMAL LOW (ref 3.5–5.1)
Sodium: 139 mmol/L (ref 135–145)
TCO2: 27 mmol/L (ref 0–100)

## 2017-04-23 LAB — MAGNESIUM: Magnesium: 2.1 mg/dL (ref 1.7–2.4)

## 2017-04-23 LAB — POCT I-STAT TROPONIN I: Troponin i, poc: 0.02 ng/mL (ref 0.00–0.08)

## 2017-04-23 MED ORDER — IBUPROFEN 200 MG PO TABS
400.0000 mg | ORAL_TABLET | Freq: Once | ORAL | Status: AC
  Administered 2017-04-23: 400 mg via ORAL
  Filled 2017-04-23: qty 2

## 2017-04-23 MED ORDER — POTASSIUM CHLORIDE CRYS ER 10 MEQ PO TBCR: 10.0000 meq | EXTENDED_RELEASE_TABLET | Freq: Two times a day (BID) | ORAL | 0 refills | Status: DC

## 2017-04-23 MED ORDER — POTASSIUM CHLORIDE 10 MEQ/100ML IV SOLN: 10.0000 meq | Freq: Once | INTRAVENOUS | Status: DC

## 2017-04-23 MED ORDER — POTASSIUM CHLORIDE CRYS ER 20 MEQ PO TBCR
40.0000 meq | EXTENDED_RELEASE_TABLET | Freq: Once | ORAL | Status: DC
  Filled 2017-04-23: qty 2

## 2017-04-23 MED ORDER — POTASSIUM CHLORIDE CRYS ER 20 MEQ PO TBCR
50.0000 meq | EXTENDED_RELEASE_TABLET | Freq: Once | ORAL | Status: AC
  Administered 2017-04-23: 50 meq via ORAL
  Filled 2017-04-23: qty 1

## 2017-04-23 MED ORDER — POTASSIUM CHLORIDE 10 MEQ/100ML IV SOLN
10.0000 meq | Freq: Once | INTRAVENOUS | Status: AC
  Administered 2017-04-23: 10 meq via INTRAVENOUS
  Filled 2017-04-23: qty 100

## 2017-04-23 MED ORDER — POTASSIUM CHLORIDE CRYS ER 20 MEQ PO TBCR: 60.0000 meq | EXTENDED_RELEASE_TABLET | Freq: Once | ORAL | Status: DC

## 2017-04-23 NOTE — ED Provider Notes (Signed)
[facility]-EMERGENCY DEPT Provider Note   CSN: [id number] Arrival date & time: 04/23/17  1755     History   Chief Complaint Chief Complaint  Patient presents with  . Chest Pain    HPI Jodi White is a 66 y.o. female.  66 year old female presents with acute onset of midsternal sharp chest pain also was standing. Pain was worse with movement of her right arm and didn't radiate to that area. Recently diagnosed with bone spurs in her right shoulder. Saw her doctor last week and received a steroid injection. No associated diaphoresis or dyspnea. No numbness to her right hand. No palpitations. Pain better with remaining still. Take aspirin prior to arrival without change of her symptoms. Patient also notes that she takes daily potassium due to a diuretic that she takes due to her history of Mnire's disease.      Past Medical History:  Diagnosis Date  . Meniere disease   . Varicose veins     Patient Active Problem List   Diagnosis Date Noted  . Varicose veins of lower extremities with complications 09/09/2014  . Varicose veins of lower extremities with other complications 06/06/2014    Past Surgical History:  Procedure Laterality Date  . ABDOMINAL HYSTERECTOMY  1988  . BUNIONECTOMY Bilateral 2005  . STAB PHLEBECTOMY Bilateral 1987  . TONSILLECTOMY      OB History    No data available       Home Medications    Prior to Admission medications   Medication Sig Start Date End Date Taking? Authorizing Provider  chlorthalidone (HYGROTON) 25 MG tablet Take 25 mg by mouth daily. 10/22/15   [provider]  fexofenadine (ALLEGRA) 180 MG tablet Take 180 mg by mouth daily. 02/13/17   [provider]  HYDROcodone-acetaminophen (NORCO/VICODIN) 5-325 MG tablet Take 0.5-1 tablets by mouth every 4 (four) hours as needed. 10/25/15   Elpidio AnisUpstill, Shari, PA-C  ondansetron (ZOFRAN ODT) 8 MG disintegrating tablet Take 1 tablet (8 mg total) by mouth every 8 (eight) hours as needed  for nausea or vomiting. 10/25/15   Elpidio AnisUpstill, Shari, PA-C  potassium chloride (K-DUR,KLOR-CON) 10 MEQ tablet Take 10 mEq by mouth daily. 10/22/15   [provider]  rizatriptan (MAXALT-MLT) 10 MG disintegrating tablet Take 1 tablet (10 mg total) by mouth as needed for migraine. May repeat in 2 hours if needed 02/23/17   Anson FretAhern, Antonia B, MD    Family History Family History  Problem Relation Age of Onset  . Cancer Mother   . Hyperlipidemia Mother   . Varicose Veins Mother   . COPD Father   . Heart disease Father   . Cancer Brother     Social History Social History  Substance Use Topics  . Smoking status: Former Smoker    Quit date: 1976  . Smokeless tobacco: Never Used  . Alcohol use No     Comment: Occasional glass of wine at night     Allergies   Patient has no known allergies.   Review of Systems Review of Systems  All other systems reviewed and are negative.    Physical Exam Updated Vital Signs BP 131/68 (BP Location: Left Arm)   Pulse 72   Temp 98.1 F (36.7 C) (Oral)   Resp 18   Ht 1.588 m (5' 2.5")   Wt 54.4 kg (120 lb)   SpO2 100%   BMI 21.60 kg/m   Physical Exam  Constitutional: She is oriented to person, place, and time. She appears well-developed and  well-nourished.  Non-toxic appearance. No distress.  HENT:  Head: Normocephalic and atraumatic.  Eyes: Pupils are equal, round, and reactive to light. Conjunctivae, EOM and lids are normal.  Neck: Normal range of motion. Neck supple. No tracheal deviation present. No thyroid mass present.  Cardiovascular: Normal rate, regular rhythm and normal heart sounds.  Exam reveals no gallop.   No murmur heard. Pulmonary/Chest: Effort normal and breath sounds normal. No stridor. No respiratory distress. She has no decreased breath sounds. She has no wheezes. She has no rhonchi. She has no rales.    Abdominal: Soft. Normal appearance and bowel sounds are normal. She exhibits no distension. There is no  tenderness. There is no rebound and no CVA tenderness.  Musculoskeletal: Normal range of motion. She exhibits no edema or tenderness.  Neurological: She is alert and oriented to person, place, and time. She has normal strength. No cranial nerve deficit or sensory deficit. GCS eye subscore is 4. GCS verbal subscore is 5. GCS motor subscore is 6.  Skin: Skin is warm and dry. No abrasion and no rash noted.  Psychiatric: She has a normal mood and affect. Her speech is normal and behavior is normal.  Nursing note and vitals reviewed.    ED Treatments / Results  Labs (all labs ordered are listed, but only abnormal results are displayed) Labs Reviewed  BASIC METABOLIC PANEL - Abnormal; Notable for the following:       Result Value   Potassium 2.7 (*)    Chloride 98 (*)    All other components within normal limits  CBC - Abnormal; Notable for the following:    WBC 10.9 (*)    MCHC 36.1 (*)    All other components within normal limits  I-STAT TROPOININ, ED  POCT I-STAT TROPONIN I    EKG  EKG Interpretation  Date/Time:  Sunday April 23 2017 17:59:54 EDT Ventricular Rate:  68 PR Interval:    QRS Duration: 80 QT Interval:  583 QTC Calculation: 621 R Axis:   34 Text Interpretation:  Sinus rhythm Abnormal R-wave progression, early transition Borderline repolarization abnormality Prolonged QT interval Baseline wander in lead(s) V1 Confirmed by Lorre Nick ([id number]) on 04/23/2017 7:04:37 PM       Radiology Dg Chest 2 View  Result Date: 04/23/2017 CLINICAL DATA:  Sudden onset of mid sternal chest pain a few hours ago, slight shortness of breath, pain to RIGHT-side of chest radiating to RIGHT arm especially exacerbated by deep inspiration, former smoker EXAM: CHEST  2 VIEW COMPARISON:  None FINDINGS: Normal heart size, mediastinal contours, and pulmonary vascularity. Atherosclerotic calcifications aorta. Lungs hyperinflated with question emphysematous changes at upper lobes. Calcified  granuloma RIGHT apex. No acute infiltrate, pleural effusion or pneumothorax. Bones demineralized. IMPRESSION: No acute abnormalities. Aortic Atherosclerosis (ICD10-I70.0). Electronically Signed   By: Ulyses Southward M.D.   On: 04/23/2017 18:46    Procedures Procedures (including critical care time)  Medications Ordered in ED Medications  potassium chloride 10 mEq in 100 mL IVPB (not administered)  potassium chloride (K-DUR,KLOR-CON) CR tablet 50 mEq (not administered)  ibuprofen (ADVIL,MOTRIN) tablet 400 mg (not administered)     Initial Impression / Assessment and Plan / ED Course  I have reviewed the triage vital signs and the nursing notes.  Pertinent labs & imaging results that were available during my care of the patient were reviewed by me and considered in my medical decision making (see chart for details).     Patient treated with IV potassium as  well as oral potassium. Also given Motrin as well 2. Magnesium normal. Repeat potassium has improved. Will adjust her daily potassium dose and she will follow with her doctor next week.  Final Clinical Impressions(s) / ED Diagnoses   Final diagnoses:  None    New Prescriptions New Prescriptions   No medications on file     Lorre Nick, MD 04/23/17 2312

## 2017-04-23 NOTE — Discharge Instructions (Signed)
Take 2 tablets of your potassium every day for the next 3 days. Increase the potassium in your diet as we discussed. If you develop dizziness, take 3 tablets of potassium and call your doctor. Your potassium to that at discharge is 2.9. Follow-up with your doctor this week for repeat

## 2017-04-23 NOTE — ED Notes (Signed)
No respiratory or acute distress noted alert and oriented x 3 call light in reach family at bedside NSR on monitor.

## 2017-04-23 NOTE — ED Triage Notes (Signed)
Walking in grocery store with sudden onset midsternal chest pain, only slight SHOB noted with pain.

## 2017-04-23 NOTE — ED Notes (Signed)
Critical K+ of 2.7 reported to RN and Dr. Freida BusmanAllen.

## 2017-04-23 NOTE — ED Notes (Signed)
No respiratory or acute distress noted resting in bed with eyes closed no reaction to medication noted call light in reach. 

## 2017-04-28 ENCOUNTER — Ambulatory Visit
Admission: RE | Admit: 2017-04-28 | Discharge: 2017-04-28 | Disposition: A | Discharge status: Home / Self Care | Payer: 59 | Source: Ambulatory Visit | Attending: Family Medicine | Admitting: Family Medicine

## 2017-04-28 DIAGNOSIS — M810 Age-related osteoporosis without current pathological fracture: Secondary | ICD-10-CM

## 2017-06-23 ENCOUNTER — Encounter: Payer: Self-pay | Admitting: Vascular Surgery

## 2017-06-23 ENCOUNTER — Ambulatory Visit (INDEPENDENT_AMBULATORY_CARE_PROVIDER_SITE_OTHER): Payer: 59 | Admitting: Vascular Surgery

## 2017-06-23 VITALS — BP 129/66 | HR 63 | Ht 62.5 in | Wt 122.0 lb

## 2017-06-23 DIAGNOSIS — I779 Disorder of arteries and arterioles, unspecified: Secondary | ICD-10-CM

## 2017-06-23 DIAGNOSIS — I739 Peripheral vascular disease, unspecified: Principal | ICD-10-CM

## 2017-06-23 NOTE — Progress Notes (Signed)
New Carotid Patient  Requested by:  Juluis Rainier, MD [address] [place], [place] [address]  Reason for consultation: bilateral carotid calcifications  History of Present Illness   Jodi White is a 66 y.o. (06/08/1951) female who presents with chief complaint: blocked neck arteries.  Previous carotid studies demonstrated: RICA <50% stenosis, LICA <70% stenosis.  Also reported she had some calcification of her carotid arteries on X-ray.  Patient has no history of TIA or stroke symptom.  The patient has never had amaurosis fugax or monocular blindness.  The patient has never had facial drooping or hemiplegia.  The patient has never had receptive or expressive aphasia.   The patient has had dizziness felt due to Meniere's disease  The patient's risks factors for carotid disease include: HLD, prior smoking.  Past Medical History:  Diagnosis Date  . Meniere disease   . Varicose veins   HLD   Past Surgical History:  Procedure Laterality Date  . ABDOMINAL HYSTERECTOMY  1988  . BUNIONECTOMY Bilateral 2005  . STAB PHLEBECTOMY Bilateral 1987  . TONSILLECTOMY      Social History   Social History  . Marital status: Married    Spouse name: N/A  . Number of children: 3  . Years of education: 14   Occupational History  . 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 Smoking status: Former Smoker    Quit date: 1976  . Smokeless tobacco: Never Used  . Alcohol use No     Comment: Occasional glass of wine at night  . Drug use: No  . Sexual activity: Not on file   Other Topics Concern  . Not on file   Social History Narrative   Lives at home with her husband   Right-handed   Caffeine: 1 cup coffee per day as well as iced tea    Family History  Problem Relation Age of Onset  . Cancer Mother   . Hyperlipidemia Mother   . Varicose Veins Mother   . COPD Father   . Heart disease Father   . Cancer Brother      Current Outpatient Prescriptions  Medication Sig Dispense Refill  . chlorthalidone (HYGROTON) 25 MG tablet Take 25 mg by mouth daily.    . fexofenadine (ALLEGRA) 180 MG tablet Take 180 mg by mouth daily.  0  . HYDROcodone-acetaminophen (NORCO/VICODIN) 5-325 MG tablet Take 0.5-1 tablets by mouth every 4 (four) hours as needed. 9 tablet 0  . ondansetron (ZOFRAN ODT) 8 MG disintegrating tablet Take 1 tablet (8 mg total) by mouth every 8 (eight) hours as needed for nausea or vomiting. 10 tablet 0  . potassium chloride (K-DUR,KLOR-CON) 10 MEQ tablet Take 1 tablet (10 mEq total) by mouth 2 (two) times daily. 30 tablet 0  . rizatriptan (MAXALT-MLT) 10 MG disintegrating tablet Take 1 tablet (10 mg total) by mouth as needed for migraine. May repeat in 2 hours if needed 9 tablet 11   No current facility-administered medications for this visit.     No Known Allergies  REVIEW OF SYSTEMS (negative unless checked):   Cardiac:   Chest pain or chest pressure?  Shortness of breath upon activity?  Shortness of breath when lying flat?  Irregular heart rhythm?  Vascular:   Pain in calf, thigh, or hip brought on by walking?  Pain in feet at night that wakes you up from your sleep?  Blood clot in  your veins?  Leg swelling?  Pulmonary:   Oxygen at home?  Productive cough?  Wheezing?  Neurologic:   Sudden weakness in arms or legs?  Sudden numbness in arms or legs?  Sudden onset of difficult speaking or slurred speech?  Temporary loss of vision in one eye?  Problems with dizziness?  Gastrointestinal:   Blood in stool?  Vomited blood?  Genitourinary:   Burning when urinating?  Blood in urine?  Psychiatric:   Major depression  Hematologic:   Bleeding problems?  Problems with blood clotting?  Dermatologic:   Rashes or ulcers?  Constitutional:   Fever or chills?  Ear/Nose/Throat:   Change in hearing?  Nose bleeds?  Sore  throat?  Musculoskeletal:   Back pain?  Joint pain?  Muscle pain?   For VQI Use Only   PRE-ADM LIVING Home  AMB STATUS Ambulatory  CAD Sx None  PRIOR CHF None  STRESS TEST No    Physical Examination      Vitals:   06/23/17 1252  BP: 129/66  Pulse: 63  SpO2: 100%  Weight: 122 lb (55.3 kg)  Height: 5' 2.5" (1.588 m)   Body mass index is 21.96 kg/m.  General Alert, O x 3, WD, NAD  Head [place]/AT,    Ear/Nose/ Throat Hearing grossly intact, nares without erythema or drainage, oropharynx without Erythema or Exudate, Mallampati score: 3,   Eyes PERRLA, EOMI,    Neck Supple, mid-line trachea,    Pulmonary Sym exp, good B air movt, CTA B  Cardiac RRR, Nl S1, S2, no Murmurs, No rubs, No S3,S4  Vascular Vessel Right Left  Radial Palpable Palpable  Brachial Palpable Palpable  Carotid Palpable, No Bruit Palpable, No Bruit  Aorta Not palpable N/A  Femoral Palpable Palpable  Popliteal Not palpable Not palpable  PT Palpable Palpable  DP Palpable Palpable    Gastro- intestinal soft, non-distended, non-tender to palpation, No guarding or rebound, no HSM, no masses, no CVAT B, No palpable prominent aortic pulse,    Musculo- skeletal M/S 5/5 throughout  , Extremities without ischemic changes  , No edema present, Varicosities present: small-moderate B, No Lipodermatosclerosis present  Neurologic Cranial nerves 2-12 intact , Pain and light touch intact in extremities , Motor exam as listed above  Psychiatric Judgement intact, Mood & affect appropriate for pt's clinical situation  Dermatologic See M/S exam for extremity exam, No rashes otherwise noted  Lymphatic  Palpable lymph nodes: None    Non-invasive Vascular Imaging   Outside B Carotid Duplex (04/28/17):  R ICA stenosis:  1-39% R VA: patent and antegrade L ICA stenosis:  40-59% (closer to 40%) L VA: patent and antegrade   Outside Studies/Documentation   5 pages of outside documents were reviewed including:  outpatient PCP chart.   Medical Decision Making   Jodi White is a 66 y.o. female who presents with: B asx ICA stenosis <80%.   Based on the patient's vascular studies and examination, I have offered the patient: annual B carotid duplex. I discussed in depth with the patient the nature of atherosclerosis, and emphasized the importance of maximal medical management including strict control of blood pressure, blood glucose, and lipid levels, obtaining regular exercise, antiplatelet agents, and cessation of smoking.   The patient is currently on a statin: Lipitor.  She has not started taking.  I recommended her proceed with such.  She has follow up with her PCP for such.  The patient is currently on an anti-platelet: ASA.  The patient is  aware that without maximal medical management the underlying atherosclerotic disease process will progress, limiting the benefit of any interventions.  Thank you for allowing [place] to participate in this patient's care.   Leonides Sake, MD, FACS [facility] Office: [phone] Pager: [phone]  06/23/2017, 1:01 PM

## 2017-07-12 NOTE — Addendum Note (Signed)
Addended by: Burton Apley A on: 07/12/2017 01:05 PM   Modules accepted: Orders

## 2017-09-28 ENCOUNTER — Other Ambulatory Visit: Payer: Self-pay | Admitting: Family Medicine

## 2017-09-28 DIAGNOSIS — Z139 Encounter for screening, unspecified: Secondary | ICD-10-CM

## 2017-10-26 ENCOUNTER — Ambulatory Visit
Admission: RE | Admit: 2017-10-26 | Discharge: 2017-10-26 | Disposition: A | Discharge status: Home / Self Care | Payer: 59 | Source: Ambulatory Visit | Attending: Family Medicine | Admitting: Family Medicine

## 2017-10-26 DIAGNOSIS — Z139 Encounter for screening, unspecified: Secondary | ICD-10-CM

## 2018-06-08 ENCOUNTER — Other Ambulatory Visit: Payer: Self-pay

## 2018-06-08 ENCOUNTER — Emergency Department (HOSPITAL_COMMUNITY)
Admission: EM | Admit: 2018-06-08 | Discharge: 2018-06-08 | Disposition: A | Discharge status: Home / Self Care | Payer: 59 | Attending: Emergency Medicine | Admitting: Emergency Medicine

## 2018-06-08 ENCOUNTER — Emergency Department (HOSPITAL_COMMUNITY): Payer: 59

## 2018-06-08 DIAGNOSIS — Y93E1 Activity, personal bathing and showering: Secondary | ICD-10-CM | POA: Diagnosis not present

## 2018-06-08 DIAGNOSIS — W182XXA Fall in (into) shower or empty bathtub, initial encounter: Secondary | ICD-10-CM | POA: Insufficient documentation

## 2018-06-08 DIAGNOSIS — S0990XA Unspecified injury of head, initial encounter: Secondary | ICD-10-CM | POA: Diagnosis present

## 2018-06-08 DIAGNOSIS — S0181XA Laceration without foreign body of other part of head, initial encounter: Secondary | ICD-10-CM | POA: Insufficient documentation

## 2018-06-08 DIAGNOSIS — Z87891 Personal history of nicotine dependence: Secondary | ICD-10-CM | POA: Insufficient documentation

## 2018-06-08 DIAGNOSIS — W0110XA Fall on same level from slipping, tripping and stumbling with subsequent striking against unspecified object, initial encounter: Secondary | ICD-10-CM | POA: Diagnosis not present

## 2018-06-08 DIAGNOSIS — Z79899 Other long term (current) drug therapy: Secondary | ICD-10-CM | POA: Insufficient documentation

## 2018-06-08 DIAGNOSIS — Y92002 Bathroom of unspecified non-institutional (private) residence single-family (private) house as the place of occurrence of the external cause: Secondary | ICD-10-CM | POA: Diagnosis not present

## 2018-06-08 DIAGNOSIS — Y999 Unspecified external cause status: Secondary | ICD-10-CM | POA: Diagnosis not present

## 2018-06-08 MED ORDER — HYDROCODONE-ACETAMINOPHEN 5-325 MG PO TABS
2.0000 | ORAL_TABLET | Freq: Once | ORAL | Status: AC
  Administered 2018-06-08: 1 via ORAL
  Filled 2018-06-08: qty 2

## 2018-06-08 MED ORDER — LIDOCAINE HCL (PF) 1 % IJ SOLN
10.0000 mL | Freq: Once | INTRAMUSCULAR | Status: AC
  Administered 2018-06-08: 10 mL
  Filled 2018-06-08: qty 30

## 2018-06-08 NOTE — ED Notes (Signed)
Pt states that she only wanted one Hydrocodone tablet because of her Meniere's Disease and pain medicine makes her sick

## 2018-06-08 NOTE — Discharge Instructions (Addendum)
Suture removal in 5-6 days Tylenol for pain

## 2018-06-08 NOTE — ED Triage Notes (Signed)
PT to ed by POV with c/o of fall in that bathroom. Pt states she spilled some shampoo in shower and hit her head. Pt states she was disoriented after initial hit but patient is A&Ox4 at this time. Patient states that her head is 3/10 but states her head feels "numb" Pt has a 2 inch long by 0.5 inch laceration above her left eye. Pt denies any LOC and not taking any blood thinners.

## 2018-06-08 NOTE — ED Provider Notes (Signed)
[facility]-EMERGENCY DEPT Provider Note   CSN: [id number] Arrival date & time: 06/08/18  1803     History   Chief Complaint Chief Complaint  Patient presents with  . Fall  . Head Injury    HPI Jodi White is a 67 y.o. female.  The history is provided by the patient. No language interpreter was used.  Fall  This is a new problem. The current episode started less than 1 hour ago. The problem occurs constantly. The problem has been gradually improving. Pertinent negatives include no chest pain and no abdominal pain. Nothing aggravates the symptoms. Nothing relieves the symptoms. She has tried nothing for the symptoms.  Head Injury    Pt reports she slipped getting out of a tub.  Pt reports there was conditioner in the tub that was slick.  Pt reports body hit first then her head hit.  Pt complains of a laceration to her face.   Past Medical History:  Diagnosis Date  . Meniere disease   . Varicose veins     Patient Active Problem List   Diagnosis Date Noted  . Varicose veins of lower extremities with complications 09/09/2014  . Varicose veins of lower extremities with other complications 06/06/2014    Past Surgical History:  Procedure Laterality Date  . ABDOMINAL HYSTERECTOMY  1988  . BUNIONECTOMY Bilateral 2005  . STAB PHLEBECTOMY Bilateral 1987  . TONSILLECTOMY       OB History   None      Home Medications    Prior to Admission medications   Medication Sig Start Date End Date Taking? Authorizing Provider  chlorthalidone (HYGROTON) 25 MG tablet Take 25 mg by mouth daily. 10/22/15  Yes [provider]  potassium chloride (K-DUR,KLOR-CON) 10 MEQ tablet Take 1 tablet (10 mEq total) by mouth 2 (two) times daily. 04/23/17  Yes Lorre Nick, MD  fexofenadine (ALLEGRA) 180 MG tablet Take 180 mg by mouth daily as needed for allergies.  02/13/17   [provider]  ondansetron (ZOFRAN ODT) 8 MG disintegrating tablet Take 1 tablet (8  mg total) by mouth every 8 (eight) hours as needed for nausea or vomiting. 10/25/15   Elpidio Anis, PA-C  rizatriptan (MAXALT-MLT) 10 MG disintegrating tablet Take 1 tablet (10 mg total) by mouth as needed for migraine. May repeat in 2 hours if needed 02/23/17   Anson Fret, MD    Family History Family History  Problem Relation Age of Onset  . Cancer Mother   . Hyperlipidemia Mother   . Varicose Veins Mother   . COPD Father   . Heart disease Father   . Cancer Brother     Social History Social History   Tobacco Use  . Smoking status: Former Smoker    Last attempt to quit: 1976    Years since quitting: 43.6  . Smokeless tobacco: Never Used  Substance Use Topics  . Alcohol use: No    Comment: Occasional glass of wine at night  . Drug use: No     Allergies   Patient has no known allergies.   Review of Systems Review of Systems  Cardiovascular: Negative for chest pain.  Gastrointestinal: Negative for abdominal pain.  All other systems reviewed and are negative.    Physical Exam Updated Vital Signs BP 115/71 (BP Location: Left Arm)   Pulse 62   Temp 98.5 F (36.9 C) (Oral)   Resp 16   Ht 5\' 2"  (1.575 m)   Wt 55.3 kg  SpO2 98%   BMI 22.31 kg/m   Physical Exam  Constitutional: She appears well-developed and well-nourished.  HENT:  Head: Normocephalic.  Right Ear: External ear normal.  Left Ear: External ear normal.  Nose: Nose normal.  Mouth/Throat: Oropharynx is clear and moist.  Eyes: Pupils are equal, round, and reactive to light.  Neck: Normal range of motion.  Cardiovascular: Normal rate.  Pulmonary/Chest: Effort normal.  Musculoskeletal: Normal range of motion.  Neurological: She is alert.  Skin:  3cm laceration forehead.   Psychiatric: She has a normal mood and affect.  Nursing note and vitals reviewed.    ED Treatments / Results  Labs (all labs ordered are listed, but only abnormal results are displayed) Labs Reviewed - No data to  display  EKG None  Radiology Ct Head Wo Contrast  Result Date: 06/08/2018 CLINICAL DATA:  Facial trauma, fall EXAM: CT HEAD WITHOUT CONTRAST TECHNIQUE: Contiguous axial images were obtained from the base of the skull through the vertex without intravenous contrast. COMPARISON:  None. FINDINGS: Brain: Bilateral cerebellar atrophy. Cerebral hemispheres normal in volume. Ventricle size normal. Negative for infarct, hemorrhage, mass. Vascular: Negative for hyperdense vessel Skull: Left frontal scalp laceration.  Negative for skull fracture Sinuses/Orbits: Negative Other: None IMPRESSION: Cerebellar atrophy.  No acute abnormality Left frontal scalp laceration. Electronically Signed   By: Marlan Palauharles  Clark M.D.   On: 06/08/2018 19:31    Procedures .Marland Kitchen.Laceration Repair Date/Time: 06/08/2018 9:43 PM Performed by: Elson AreasSofia, Leslie K, PA-C Authorized by: Elson AreasSofia, Leslie K, PA-C   Consent:    Consent obtained:  Verbal   Consent given by:  Patient   Risks discussed:  Infection, need for additional repair, pain, poor cosmetic result and poor wound healing   Alternatives discussed:  No treatment and delayed treatment Universal protocol:    Procedure explained and questions answered to patient or proxy's satisfaction: yes     Relevant documents present and verified: yes     Test results available and properly labeled: yes     Imaging studies available: yes     Immediately prior to procedure, a time out was called: yes     Patient identity confirmed:  Verbally with patient Anesthesia (see MAR for exact dosages):    Anesthesia method:  Local infiltration   Local anesthetic:  Lidocaine 1% w/o epi Laceration details:    Location:  Face   Face location:  Forehead   Length (cm):  3   Depth (mm):  2 Repair type:    Repair type:  Simple Pre-procedure details:    Preparation:  Patient was prepped and draped in usual sterile fashion Exploration:    Hemostasis achieved with:  Direct pressure   Wound extent:  no foreign bodies/material noted and no nerve damage noted     Contaminated: no   Treatment:    Area cleansed with:  Betadine   Amount of cleaning:  Standard   Irrigation solution:  Sterile saline   Visualized foreign bodies/material removed: no   Skin repair:    Repair method:  Sutures   Suture size:  6-0   Suture material:  Prolene   Suture technique:  Simple interrupted   Number of sutures:  10 Approximation:    Approximation:  Loose Post-procedure details:    Dressing:  Open (no dressing)   Patient tolerance of procedure:  Tolerated well, no immediate complications   (including critical care time)  Medications Ordered in ED Medications  lidocaine (PF) (XYLOCAINE) 1 % injection 10 mL (10 mLs  Infiltration Given by Other 06/08/18 1820)  HYDROcodone-acetaminophen (NORCO/VICODIN) 5-325 MG per tablet 2 tablet (1 tablet Oral Given 06/08/18 1837)     Initial Impression / Assessment and Plan / ED Course  I have reviewed the triage vital signs and the nursing notes.  Pertinent labs & imaging results that were available during my care of the patient were reviewed by me and considered in my medical decision making (see chart for details).     MDM  Ct head no acute abnormality.   Pt counseled on sutures.  Pt advised suture removal in 5 days.    Final Clinical Impressions(s) / ED Diagnoses   Final diagnoses:  Injury of head, initial encounter  Laceration of skin of forehead, initial encounter    ED Discharge Orders    None    An After Visit Summary was printed and given to the patient.    Elson Areas, Cordelia Poche 06/08/18 2145    Virgina Norfolk, DO 06/08/18 2210

## 2019-07-08 ENCOUNTER — Telehealth: Payer: Self-pay | Admitting: Neurology

## 2019-07-08 NOTE — Telephone Encounter (Signed)
I spoke with Dr Jaynee Eagles about pt having a 5 day migraine I stated pt was last seen 02/2917. PT wanted a sooner appt. She stated pt can see [facility] NP because its not a new issue.

## 2019-07-08 NOTE — Telephone Encounter (Signed)
Pt is asking for a call to discuss this being day 5 of a migraine and the 1st day in which she is able to sit up and function in oppose to day 1-4.  Please call, pt has scheduled an office visit and is on wait list

## 2019-07-08 NOTE — Telephone Encounter (Signed)
I called pt about her having migraines 5 days. I stated she was last seen May 2018 and only had one visit. Pt described it as left side piercing and pain sometimes nausea. She took a maxalt medication and stated it was a old medication. She stated the maxalt did help her migraine She has to lay down for it to calm down. I offer pt an appt with AMy NP on 07/11/2019 this Thursday. Pt decline 07/11/2019 stating she had another commitment. I offer her 07/15/2019 at 0730 and  explain check in at 0700 no later than 715. Pt wanted a refill on zofran. I stated Dr.Ahern cannot refill her zofran or do refills until she is seen in the office. Pt has been seeing ENT for Meniers disease. I recommend she call her PCP for any nausea medicine until she is seen by Amy NP. Pt wanted me to schedule her for Thursday and keep the Monday appt. I stated that I cant put her down for two appts. Pt will call back if she cancel her prior committment on Thursday she will r/s with Amy NP if the Thursday appt is still available.

## 2019-07-15 ENCOUNTER — Encounter: Payer: Self-pay | Admitting: Family Medicine

## 2019-07-15 ENCOUNTER — Other Ambulatory Visit: Payer: Self-pay

## 2019-07-15 ENCOUNTER — Ambulatory Visit (INDEPENDENT_AMBULATORY_CARE_PROVIDER_SITE_OTHER): Payer: 59 | Admitting: Family Medicine

## 2019-07-15 VITALS — BP 129/66 | HR 66 | Temp 97.8°F | Ht 62.0 in | Wt 123.5 lb

## 2019-07-15 DIAGNOSIS — G43809 Other migraine, not intractable, without status migrainosus: Secondary | ICD-10-CM

## 2019-07-15 MED ORDER — RIZATRIPTAN BENZOATE 10 MG PO TBDP: 10.0000 mg | ORAL_TABLET | ORAL | 11 refills | Status: DC | PRN

## 2019-07-15 MED ORDER — ONDANSETRON 8 MG PO TBDP: 8.0000 mg | ORAL_TABLET | Freq: Three times a day (TID) | ORAL | 0 refills | Status: DC | PRN

## 2019-07-15 NOTE — Patient Instructions (Signed)
Maxalt and Zofran as needed for abortive therapy  Follow up with PCP for refills, return to [place] if migraines worsen   Migraine Headache A migraine headache is a very strong throbbing pain on one side or both sides of your head. This type of headache can also cause other symptoms. It can last from 4 hours to 3 days. Talk with your doctor about what things may bring on (trigger) this condition. What are the causes? The exact cause of this condition is not known. This condition may be triggered or caused by:  Drinking alcohol.  Smoking.  Taking medicines, such as: ? Medicine used to treat chest pain (nitroglycerin). ? Birth control pills. ? Estrogen. ? Some blood pressure medicines.  Eating or drinking certain products.  Doing physical activity. Other things that may trigger a migraine headache include:  Having a menstrual period.  Pregnancy.  Hunger.  Stress.  Not getting enough sleep or getting too much sleep.  Weather changes.  Tiredness (fatigue). What increases the risk?  Being 36-43 years old.  Being female.  Having a family history of migraine headaches.  Being Caucasian.  Having depression or anxiety.  Being very overweight. What are the signs or symptoms?  A throbbing pain. This pain may: ? Happen in any area of the head, such as on one side or both sides. ? Make it hard to do daily activities. ? Get worse with physical activity. ? Get worse around bright lights or loud noises.  Other symptoms may include: ? Feeling sick to your stomach (nauseous). ? Vomiting. ? Dizziness. ? Being sensitive to bright lights, loud noises, or smells.  Before you get a migraine headache, you may get warning signs (an aura). An aura may include: ? Seeing flashing lights or having blind spots. ? Seeing bright spots, halos, or zigzag lines. ? Having tunnel vision or blurred vision. ? Having numbness or a tingling feeling. ? Having trouble talking. ? Having weak  muscles.  Some people have symptoms after a migraine headache (postdromal phase), such as: ? Tiredness. ? Trouble thinking (concentrating). How is this treated?  Taking medicines that: ? Relieve pain. ? Relieve the feeling of being sick to your stomach. ? Prevent migraine headaches.  Treatment may also include: ? Having acupuncture. ? Avoiding foods that bring on migraine headaches. ? Learning ways to control your body functions (biofeedback). ? Therapy to help you know and deal with negative thoughts (cognitive behavioral therapy). Follow these instructions at home: Medicines  Take over-the-counter and prescription medicines only as told by your doctor.  Ask your doctor if the medicine prescribed to you: ? Requires you to avoid driving or using heavy machinery. ? Can cause trouble pooping (constipation). You may need to take these steps to prevent or treat trouble pooping:  Drink enough fluid to keep your pee (urine) pale yellow.  Take over-the-counter or prescription medicines.  Eat foods that are high in fiber. These include beans, whole grains, and fresh fruits and vegetables.  Limit foods that are high in fat and sugar. These include fried or sweet foods. Lifestyle  Do not drink alcohol.  Do not use any products that contain nicotine or tobacco, such as cigarettes, e-cigarettes, and chewing tobacco. If you need help quitting, ask your doctor.  Get at least 8 hours of sleep every night.  Limit and deal with stress. General instructions      Keep a journal to find out what may bring on your migraine headaches. For example, write  down: ? What you eat and drink. ? How much sleep you get. ? Any change in what you eat or drink. ? Any change in your medicines.  If you have a migraine headache: ? Avoid things that make your symptoms worse, such as bright lights. ? It may help to lie down in a dark, quiet room. ? Do not drive or use heavy machinery. ? Ask your  doctor what activities are safe for you.  Keep all follow-up visits as told by your doctor. This is important. Contact a doctor if:  You get a migraine headache that is different or worse than others you have had.  You have more than 15 headache days in one month. Get help right away if:  Your migraine headache gets very bad.  Your migraine headache lasts longer than 72 hours.  You have a fever.  You have a stiff neck.  You have trouble seeing.  Your muscles feel weak or like you cannot control them.  You start to lose your balance a lot.  You start to have trouble walking.  You pass out (faint).  You have a seizure. Summary  A migraine headache is a very strong throbbing pain on one side or both sides of your head. These headaches can also cause other symptoms.  This condition may be treated with medicines and changes to your lifestyle.  Keep a journal to find out what may bring on your migraine headaches.  Contact a doctor if you get a migraine headache that is different or worse than others you have had.  Contact your doctor if you have more than 15 headache days in a month. This information is not intended to replace advice given to you by your health care provider. Make sure you discuss any questions you have with your health care provider. Document Released: 07/05/2008 Document Revised: 01/18/2019 Document Reviewed: 11/08/2018 Elsevier Patient Education  2020 [employer].

## 2019-07-15 NOTE — Progress Notes (Addendum)
PATIENT: Jodi CorinJoyce White DOB: Sep 20, 1951  REASON FOR VISIT: follow up HISTORY FROM: patient  Chief Complaint  Patient presents with  . Follow-up    Migraine f/u. Alone. Rm 8. Patient mentioned that she no longer has a migraine. She stated that her migraine lasted 8 days.      HISTORY OF PRESENT ILLNESS: Today 07/15/19 Jodi CorinJoyce White is a 68 y.o. female here today for follow up for migraines. She has a history of Meniere's Disease and vestibular migraines last seen by Dr Lucia GaskinsAhern in 02/2017. She was given Maxalt for abortive therapy but reports that she has rarely needed it. She also takes Careers adviserAllegra for seasonal allergies. Last week she had a migraine that was persistent for 8 days. She reports that pain started in the center of her head. Pain radiated to left eye. She has a hard time describing that pain. She does report a sharp stabbing sensation at one point. Pressure at others. She had dizziness and felt off balance. She had light and sound sensitivity. Nausea and vomiting. She denies visual changes. Her son is a PA and gave her Maxalt which did help. She has taken ondansetron in the past that also helps. No weakness, gait changes, speech changes, or confusion.    HISTORY: (copied from Dr Trevor MaceAhern's note on 02/23/2017)  HPI:  Jodi DoffingJoyce White is a 68 y.o. female here as a referral from Dr. Haroldine Lawsrossley for Vestibular migraines. She has a past medical history of Mnire's disease. She also has some sinus issues in allergy issues as well and she was recently seen by ear nose and throat for increased Mnire symptoms. She has low frequency sensorineural hearing loss and she had maxillary sinusitis and last seen by Dr. Haroldine Lawsrossley and in April. Patient started having vertigo several years ago. She went to see Dr. Haroldine Lawsrossley and he diagnosed Meniere's disease in 2015. If she sticks to her diet of low sodium she does well. She takes a diuretic and it has been fine. Since being treated she has significantly improved. She  has a history of migranes. She had a recurrence of Meniere's with a headache recently. Episode started last Wednesday. She has been eating a lot of sodium without knowing it in popcorn. This last episode had a headache which is different. The meniere's symptoms started and then the headche started. Laying down helps. The headache was dull, she was in a dark room but doesn't know if light bothered her. She was nauseated and dizzy. Not really migrainous per patient. Never had dizziness with migraines and her last migraine was 18 years ago. She started having migraines in early 6240s. She fell asleep and everything got better. The episode lasted a week and she felt off-balance and dizzy. She takes chlorthalidone daily and when she feels the episodes coming on. She hasn't even had a headache in years and headaches resolved and never came back. No other focal neurologic deficits, associated symptoms, inciting events or modifiable factors.  Reviewed notes, labs and imaging from outside physicians, which showed:  She has a past medical history of Mnire's disease. She also has some sinus issues in allergy issues as well and she was recently seen by ear nose and throat for increased Mnire symptoms. She has low frequency sensorineural hearing loss and she had maxillary sinusitis and last seen by Dr. Haroldine Lawsrossley and in April. Her high [place] was increased and Allegra. She reported to Dr. Haroldine Lawsrossley increased vertigo and some vomiting. She had an MRI 3 years ago. MRI was  totally normal. Exam showed clear ears, tympanic membranes, audiogram negative in terms of any deterioration, nose and throat completely clear, no mass or lesions, all cranial nerves intact, larynx clear. She was diagnosed with allergic rhinitis, Mnire's bilaterally, tinnitus bilaterally and vertigo. She was given prednisone 6 day dose pack and also Phenergan suppository. But he also diagnosed her with concomitant vestibular migraine. She has neck pain  occ. No other focal neurologic deficits, associated symptoms, inciting events or modifiable factors.  Reviewed report from 2016 ultrasound carotids bilaterally:   IMPRESSION:  Moderate scattered bilateral carotid plaque.  No hemodynamically significant stenosis.  Patent vertebral arteries with antegrade flow bilaterally.  Stenosis as determined by [facility] criteria, which is ICAVL accredited and internally validated.  CERVICAL SPINE 4+ VIEWS  COMPARISON: None.  Personally reviewed images and agree with the following: Diffuse severe multilevel degenerative change, most prominent at C4-C5, C5-C6, and C6-C7. Loss of normal cervical lordosis, this most likely secondary to degenerative change. Carotid calcification noted consistent with carotid atherosclerotic vascular disease.  IMPRESSION: 1. Diffuse severe degenerative change. No acute bony abnormality.  2. Carotid atherosclerotic vascular disease.   REVIEW OF SYSTEMS: Out of a complete 14 system review of symptoms, the patient complains only of the following symptoms, dizziness, headache and all other reviewed systems are negative.   ALLERGIES: No Known Allergies  HOME MEDICATIONS: Outpatient Medications Prior to Visit  Medication Sig Dispense Refill  . chlorthalidone (HYGROTON) 25 MG tablet Take 25 mg by mouth daily.    . fexofenadine (ALLEGRA) 180 MG tablet Take 180 mg by mouth daily as needed for allergies.   0  . potassium chloride (K-DUR,KLOR-CON) 10 MEQ tablet Take 1 tablet (10 mEq total) by mouth 2 (two) times daily. 30 tablet 0  . ondansetron (ZOFRAN ODT) 8 MG disintegrating tablet Take 1 tablet (8 mg total) by mouth every 8 (eight) hours as needed for nausea or vomiting. 10 tablet 0  . rizatriptan (MAXALT-MLT) 10 MG disintegrating tablet Take 1 tablet (10 mg total) by mouth as needed for migraine. May repeat in 2 hours if needed 9 tablet 11   No facility-administered medications prior  to visit.     PAST MEDICAL HISTORY: Past Medical History:  Diagnosis Date  . Meniere disease   . Varicose veins     PAST SURGICAL HISTORY: Past Surgical History:  Procedure Laterality Date  . ABDOMINAL HYSTERECTOMY  1988  . BUNIONECTOMY Bilateral 2005  . STAB PHLEBECTOMY Bilateral 1987  . TONSILLECTOMY      FAMILY HISTORY: Family History  Problem Relation Age of Onset  . Cancer Mother   . Hyperlipidemia Mother   . Varicose Veins Mother   . COPD Father   . Heart disease Father   . Cancer Brother     SOCIAL HISTORY: Social History   Socioeconomic History  . Marital status: Married    Spouse name: Not on file  . Number of children: 3  . Years of education: 43  . Highest education level: Not on file  Occupational History  . 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 Financial resource strain: Not on file  . Food insecurity    Worry: Not on file    Inability: Not on file  . Transportation needs    Medical: Not on file    Non-medical: Not on file  Tobacco Use  . Smoking status: Former Smoker    Quit date: 1976  Years since quitting: 44.7  . Smokeless tobacco: Never Used  Substance and Sexual Activity  . Alcohol use: No    Comment: Occasional glass of wine at night  . Drug use: No  . Sexual activity: Not on file  Lifestyle  . Physical activity    Days per week: Not on file    Minutes per session: Not on file  . Stress: Not on file  Relationships  . Social Herbalist on phone: Not on file    Gets together: Not on file    Attends religious service: Not on file    Active member of club or organization: Not on file    Attends meetings of clubs or organizations: Not on file    Relationship status: Not on file  . Intimate partner violence    Fear of current or ex partner: Not on file    Emotionally abused: Not on file    Physically abused: Not on file    Forced sexual activity: Not on file   Other Topics Concern  . Not on file  Social History Narrative   Lives at home with her husband   Right-handed   Caffeine: 1 cup coffee per day as well as iced tea      PHYSICAL EXAM  Vitals:   07/15/19 0718  BP: 129/66  Pulse: 66  Temp: 97.8 F (36.6 C)  TempSrc: Oral  Weight: 123 lb 8 oz (56 kg)  Height: 5\' 2"  (1.575 m)   Body mass index is 22.59 kg/m.  Generalized: Well developed, in no acute distress  Cardiology: normal rate and rhythm, no murmur noted Neurological examination  Mentation: Alert oriented to time, place, history taking. Follows all commands speech and language fluent Cranial nerve II-XII: Pupils were equal round reactive to light. Extraocular movements were full, visual field were full on confrontational test. Facial sensation and strength were normal. Uvula tongue midline. Head turning and shoulder shrug  were normal and symmetric. Motor: The motor testing reveals 5 over 5 strength of all 4 extremities. Good symmetric motor tone is noted throughout.  Sensory: Sensory testing is intact to soft touch on all 4 extremities. No evidence of extinction is noted.  Coordination: Cerebellar testing reveals good finger-nose-finger and heel-to-shin bilaterally.  Gait and station: Gait is normal. Tandem gait is normal. Romberg is negative. No drift is seen.  Reflexes: Deep tendon reflexes are symmetric and normal bilaterally.   DIAGNOSTIC DATA (LABS, IMAGING, TESTING) - I reviewed patient records, labs, notes, testing and imaging myself where available.  No flowsheet data found.   Lab Results  Component Value Date   WBC 10.9 (H) 04/23/2017   HGB 11.9 (L) 04/23/2017   HCT 35.0 (L) 04/23/2017   MCV 89.2 04/23/2017   PLT 195 04/23/2017      Component Value Date/Time   NA 139 04/23/2017 2222   K 2.9 (L) 04/23/2017 2222   CL 99 (L) 04/23/2017 2222   CO2 28 04/23/2017 1821   GLUCOSE 107 (H) 04/23/2017 2222   BUN 11 04/23/2017 2222   CREATININE 0.70 04/23/2017  2222   CALCIUM 9.4 04/23/2017 1821   GFRNONAA >60 04/23/2017 1821   GFRAA >60 04/23/2017 1821   No results found for: CHOL, HDL, LDLCALC, LDLDIRECT, TRIG, CHOLHDL No results found for: HGBA1C No results found for: VITAMINB12 No results found for: TSH     ASSESSMENT AND PLAN 68 y.o. year old female  has a past medical history of Meniere disease and  Varicose veins. here with     ICD-10-CM   1. Vestibular migraine  G43.809     Neema has done very well over the past two year but unfortunately experienced a migraine last week. Migraine was easily aborted with rizatriptan. We will refill rizatriptan and ondansetron for abortive therapy. She was advised to monitor symptoms closely. She will follow up with [place] for worsening, otherwise, she can follow up with PCP for refills. She verbalizes understanding and agreement with plan.    No orders of the defined types were placed in this encounter.    Meds ordered this encounter  Medications  . rizatriptan (MAXALT-MLT) 10 MG disintegrating tablet    Sig: Take 1 tablet (10 mg total) by mouth as needed for migraine. May repeat in 2 hours if needed    Dispense:  9 tablet    Refill:  11    Order Specific Question:   Supervising Provider    Answer:   Anson Fret [id number]  . ondansetron (ZOFRAN ODT) 8 MG disintegrating tablet    Sig: Take 1 tablet (8 mg total) by mouth every 8 (eight) hours as needed for nausea or vomiting.    Dispense:  10 tablet    Refill:  0    Order Specific Question:   Supervising Provider    Answer:   Anson Fret [id number]      I spent 15 minutes with the patient. 50% of this time was spent counseling and educating patient on plan of care and medications.    Shawnie Dapper, FNP-C 07/15/2019, 8:17 AM [facility] [address] [place], [place] [address] [phone]  Made any corrections needed, and agree with history, physical, neuro exam,assessment and plan as stated.     Naomie Dean, MD [facility]

## 2019-08-12 ENCOUNTER — Ambulatory Visit: Payer: Medicare Other | Admitting: Neurology

## 2020-09-24 ENCOUNTER — Other Ambulatory Visit: Payer: 59

## 2020-09-24 DIAGNOSIS — Z20822 Contact with and (suspected) exposure to covid-19: Secondary | ICD-10-CM

## 2020-09-25 LAB — SARS-COV-2, NAA 2 DAY TAT

## 2020-09-25 LAB — NOVEL CORONAVIRUS, NAA: SARS-CoV-2, NAA: NOT DETECTED

## 2020-11-06 ENCOUNTER — Other Ambulatory Visit: Payer: 59

## 2020-11-06 DIAGNOSIS — Z20822 Contact with and (suspected) exposure to covid-19: Secondary | ICD-10-CM

## 2020-11-08 LAB — SARS-COV-2, NAA 2 DAY TAT

## 2020-11-08 LAB — NOVEL CORONAVIRUS, NAA: SARS-CoV-2, NAA: NOT DETECTED

## 2021-02-11 ENCOUNTER — Telehealth: Payer: Self-pay | Admitting: Neurology

## 2021-02-11 NOTE — Telephone Encounter (Addendum)
Spoke with Dr Lucia Gaskins. This is not something she can provide for the patient.

## 2021-02-11 NOTE — Telephone Encounter (Addendum)
Spoke with patient and advised per Dr. Lucia Gaskins that this is not something she can provide.  The patient's questions were answered and she verbalized understanding.

## 2021-02-11 NOTE — Telephone Encounter (Signed)
Patient walked into the lobby today asking for detailed letter to get out of jury duty due to her migraines. She needs this letter as soon as possible.  Best call back is [phone]

## 2021-04-22 ENCOUNTER — Ambulatory Visit: Payer: Medicare Other | Admitting: Family Medicine

## 2021-09-10 ENCOUNTER — Other Ambulatory Visit: Payer: Self-pay | Admitting: Family Medicine

## 2021-09-10 DIAGNOSIS — M810 Age-related osteoporosis without current pathological fracture: Secondary | ICD-10-CM

## 2021-09-10 DIAGNOSIS — Z1231 Encounter for screening mammogram for malignant neoplasm of breast: Secondary | ICD-10-CM

## 2021-09-16 ENCOUNTER — Ambulatory Visit
Admission: RE | Admit: 2021-09-16 | Discharge: 2021-09-16 | Disposition: A | Discharge status: Home / Self Care | Payer: Medicare Other | Source: Ambulatory Visit | Attending: Family Medicine | Admitting: Family Medicine

## 2021-09-16 DIAGNOSIS — Z1231 Encounter for screening mammogram for malignant neoplasm of breast: Secondary | ICD-10-CM

## 2022-02-14 ENCOUNTER — Ambulatory Visit
Admission: RE | Admit: 2022-02-14 | Discharge: 2022-02-14 | Disposition: A | Discharge status: Home / Self Care | Payer: Medicare Other | Source: Ambulatory Visit | Attending: Family Medicine | Admitting: Family Medicine

## 2022-02-14 DIAGNOSIS — M810 Age-related osteoporosis without current pathological fracture: Secondary | ICD-10-CM

## 2022-03-10 ENCOUNTER — Encounter (HOSPITAL_BASED_OUTPATIENT_CLINIC_OR_DEPARTMENT_OTHER): Payer: Self-pay | Admitting: *Deleted

## 2022-03-10 ENCOUNTER — Emergency Department (HOSPITAL_BASED_OUTPATIENT_CLINIC_OR_DEPARTMENT_OTHER): Payer: Medicare Other | Admitting: Radiology

## 2022-03-10 ENCOUNTER — Emergency Department (HOSPITAL_BASED_OUTPATIENT_CLINIC_OR_DEPARTMENT_OTHER)
Admission: EM | Admit: 2022-03-10 | Discharge: 2022-03-11 | Disposition: A | Discharge status: Home / Self Care | Payer: Medicare Other | Attending: Emergency Medicine | Admitting: Emergency Medicine

## 2022-03-10 ENCOUNTER — Emergency Department (HOSPITAL_BASED_OUTPATIENT_CLINIC_OR_DEPARTMENT_OTHER): Payer: Medicare Other

## 2022-03-10 ENCOUNTER — Other Ambulatory Visit: Payer: Self-pay

## 2022-03-10 DIAGNOSIS — Z87891 Personal history of nicotine dependence: Secondary | ICD-10-CM | POA: Diagnosis not present

## 2022-03-10 DIAGNOSIS — W098XXA Fall on or from other playground equipment, initial encounter: Secondary | ICD-10-CM | POA: Diagnosis not present

## 2022-03-10 DIAGNOSIS — S32591A Other specified fracture of right pubis, initial encounter for closed fracture: Secondary | ICD-10-CM | POA: Diagnosis not present

## 2022-03-10 DIAGNOSIS — S0990XA Unspecified injury of head, initial encounter: Secondary | ICD-10-CM | POA: Insufficient documentation

## 2022-03-10 DIAGNOSIS — S79911A Unspecified injury of right hip, initial encounter: Secondary | ICD-10-CM | POA: Diagnosis present

## 2022-03-10 NOTE — ED Triage Notes (Signed)
Pt fell while playing pickle ball and has an abrasion to right elbow, right elbow and pain in groin making it very painful to walk.

## 2022-03-11 ENCOUNTER — Emergency Department (HOSPITAL_BASED_OUTPATIENT_CLINIC_OR_DEPARTMENT_OTHER): Payer: Medicare Other | Admitting: Radiology

## 2022-03-11 MED ORDER — HYDROMORPHONE HCL 1 MG/ML IJ SOLN
0.5000 mg | Freq: Once | INTRAMUSCULAR | Status: AC
Start: 2022-03-11 — End: 2022-03-11
  Administered 2022-03-11: 0.5 mg via INTRAMUSCULAR
  Filled 2022-03-11: qty 1

## 2022-03-11 MED ORDER — OXYCODONE HCL 5 MG PO TABS: 5.0000 mg | ORAL_TABLET | ORAL | 0 refills | Status: DC | PRN

## 2022-03-11 NOTE — ED Notes (Signed)
Pt agreeable with d/c plan as discussed by provider- this nurse has verbally reinforced d/c instructions and provided pt with written copy; pt has given verbal consent for spouse to sign acknowledge of d/c instructions -- no additonal questions, concerns, needs verbalized -- pt assisted into w/c and escorted to exit by ED tech; spouse to drive patient home

## 2022-03-11 NOTE — ED Provider Notes (Signed)
[facility] EMERGENCY [facility] Emergency Department Provider Note MRN:  [id number]  Arrival date & time: 03/11/22     Chief Complaint   Fall   History of Present Illness   Jodi White is a 71 y.o. year-old female with no pertinent past medical history presenting to the ED with chief complaint of fall.  Patient stumbled when trying to reach the pickleball.  He fell awkwardly onto the right hip.  Having pain to the right hip and groin, also some pain to the lower back, right elbow.  Mild head trauma but no loss of consciousness, no vomiting.  No chest pain, no abdominal pain, no shortness of breath.  Review of Systems  A thorough review of systems was obtained and all systems are negative except as noted in the HPI and PMH.   Patient's Health History    Past Medical History:  Diagnosis Date   Meniere disease    Varicose veins     Past Surgical History:  Procedure Laterality Date   ABDOMINAL HYSTERECTOMY  1988   BUNIONECTOMY Bilateral 2005   STAB PHLEBECTOMY Bilateral 1987   TONSILLECTOMY      Family History  Problem Relation Age of Onset   Cancer Mother    Hyperlipidemia Mother    Varicose Veins Mother    COPD Father    Heart disease Father    Cancer Brother     Social History   Socioeconomic History   Marital status: Married    Spouse name: Not on file   Number of children: 3   Years of education: 14   Highest education level: Not on file  Occupational History   Occupation: Retired    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[employer], [employer]  Tobacco Use   Smoking status: Former    Types: Cigarettes    Quit date: 1976    Years since quitting: 47.4   Smokeless tobacco: Never  Vaping Use   Vaping Use: Never used  Substance and Sexual Activity   Alcohol use: No    Comment: Occasional glass of wine at night   Drug use: No   Sexual activity: Not on file  Other Topics Concern   Not on file  Social History Narrative   Lives at home with her husband    Right-handed   Caffeine: 1 cup coffee per day as well as iced tea   Social Determinants of Corporate investment banker Strain: Not on file  Food Insecurity: Not on file  Transportation Needs: Not on file  Physical Activity: Not on file  Stress: Not on file  Social Connections: Not on file  Intimate Partner Violence: Not on file     Physical Exam   Vitals:   03/10/22 2230 03/10/22 2330  BP: 122/66 (!) 170/73  Pulse: 65 64  Resp: 18 16  Temp:    SpO2: 97% 100%    CONSTITUTIONAL: Well-appearing, NAD NEURO/PSYCH:  Alert and oriented x 3, no focal deficits EYES:  eyes equal and reactive ENT/NECK:  no LAD, no JVD CARDIO: Regular rate, well-perfused, normal S1 and S2 PULM:  CTAB no wheezing or rhonchi GI/GU:  non-distended, non-tender MSK/SPINE:  No gross deformities, no edema SKIN:  no rash, atraumatic   *Additional and/or pertinent findings included in MDM below  Diagnostic and Interventional Summary    EKG Interpretation  Date/Time:    Ventricular Rate:    PR Interval:    QRS Duration:   QT Interval:    QTC  Calculation:   R Axis:     Text Interpretation:         Labs Reviewed - No data to display  DG Lumbar Spine 2-3 Views  Final Result    DG HIP UNILAT WITH PELVIS 2-3 VIEWS RIGHT  Final Result    DG Elbow Complete Right  Final Result    DG Shoulder Right  Final Result    CT Head Wo Contrast  Final Result    DG Pelvis 1-2 Views  Final Result      Medications  HYDROmorphone (DILAUDID) injection 0.5 mg (0.5 mg Intramuscular Given 03/11/22 0021)     Procedures  /  Critical Care Procedures  ED Course and Medical Decision Making  Initial Impression and Ddx Fall, mechanical, history of osteoporosis, having some pelvic and/or hip pain, low back pain.  Will need x-rays to exclude fracture.  Past medical/surgical history that increases complexity of ED encounter: Osteopenia  Interpretation of Diagnostics I personally reviewed the hip x-ray and  my interpretation is as follows: Inferior pubic ramus fracture    Patient Reassessment and Ultimate Disposition/Management     Patient's pain is adequately controlled, able to ambulate, has help at home, appropriate for discharge.  Patient management required discussion with the following services or consulting groups:  None  Complexity of Problems Addressed Acute illness or injury that poses threat of life of bodily function  Additional Data Reviewed and Analyzed Further history obtained from: Further history from spouse/family member  Additional Factors Impacting ED Encounter Risk Prescriptions and Consideration of hospitalization  Elmer Sow. Pilar Plate, MD [facility] Emergency Medicine [facility] [email or web address]  Final Clinical Impressions(s) / ED Diagnoses     ICD-10-CM   1. Closed fracture of right inferior pubic ramus, initial encounter [facility])  S32.591A       ED Discharge Orders     None        Discharge Instructions Discussed with and Provided to Patient:     Discharge Instructions      You were evaluated in the Emergency Department and after careful evaluation, we did not find any emergent condition requiring admission or further testing in the hospital.  Your exam/testing today was overall reassuring.  Symptoms are due to an inferior pubic ramus fracture.  Recommend follow-up with the orthopedic specialist for continued care.  Recommend Tylenol 1000 mg every 4-6 hours and/or Motrin 600 mg every 4-6 hours for pain.  You can use the oxycodone medication for more significant pain.  Please return to the Emergency Department if you experience any worsening of your condition.  Thank you for allowing [place] to be a part of your care.        Sabas Sous, MD 03/11/22 Earle Gell

## 2022-03-11 NOTE — Discharge Instructions (Addendum)
You were evaluated in the Emergency Department and after careful evaluation, we did not find any emergent condition requiring admission or further testing in the hospital.  Your exam/testing today was overall reassuring.  Symptoms are due to an inferior pubic ramus fracture.  Recommend follow-up with the orthopedic specialist for continued care.  Recommend Tylenol 1000 mg every 4-6 hours and/or Motrin 600 mg every 4-6 hours for pain.  You can use the oxycodone medication for more significant pain.  Please return to the Emergency Department if you experience any worsening of your condition.  Thank you for allowing [place] to be a part of your care.

## 2022-03-11 NOTE — ED Notes (Signed)
Patient transported to X-ray via stretcher 

## 2023-11-15 ENCOUNTER — Emergency Department (HOSPITAL_COMMUNITY): Payer: Medicare Other

## 2023-11-15 ENCOUNTER — Emergency Department (HOSPITAL_COMMUNITY)
Admission: EM | Admit: 2023-11-15 | Discharge: 2023-11-15 | Disposition: A | Discharge status: Home / Self Care | Payer: Medicare Other | Attending: Emergency Medicine | Admitting: Emergency Medicine

## 2023-11-15 ENCOUNTER — Other Ambulatory Visit: Payer: Self-pay

## 2023-11-15 ENCOUNTER — Encounter (HOSPITAL_COMMUNITY): Payer: Self-pay

## 2023-11-15 DIAGNOSIS — Z79899 Other long term (current) drug therapy: Secondary | ICD-10-CM | POA: Diagnosis not present

## 2023-11-15 DIAGNOSIS — Z20822 Contact with and (suspected) exposure to covid-19: Secondary | ICD-10-CM | POA: Diagnosis not present

## 2023-11-15 DIAGNOSIS — R112 Nausea with vomiting, unspecified: Secondary | ICD-10-CM | POA: Diagnosis present

## 2023-11-15 DIAGNOSIS — R42 Dizziness and giddiness: Secondary | ICD-10-CM | POA: Insufficient documentation

## 2023-11-15 LAB — CBC
HCT: 39.9 % (ref 36.0–46.0)
Hemoglobin: 13.5 g/dL (ref 12.0–15.0)
MCH: 32 pg (ref 26.0–34.0)
MCHC: 33.8 g/dL (ref 30.0–36.0)
MCV: 94.5 fL (ref 80.0–100.0)
Platelets: 206 10*3/uL (ref 150–400)
RBC: 4.22 MIL/uL (ref 3.87–5.11)
RDW: 12.6 % (ref 11.5–15.5)
WBC: 9.3 10*3/uL (ref 4.0–10.5)
nRBC: 0 % (ref 0.0–0.2)

## 2023-11-15 LAB — URINALYSIS, ROUTINE W REFLEX MICROSCOPIC
Bilirubin Urine: NEGATIVE
Glucose, UA: NEGATIVE mg/dL
Hgb urine dipstick: NEGATIVE
Ketones, ur: 20 mg/dL — AB
Leukocytes,Ua: NEGATIVE
Nitrite: NEGATIVE
Protein, ur: NEGATIVE mg/dL
Specific Gravity, Urine: 1.012 (ref 1.005–1.030)
pH: 6 (ref 5.0–8.0)

## 2023-11-15 LAB — HEPATIC FUNCTION PANEL
ALT: 24 U/L (ref 0–44)
AST: 28 U/L (ref 15–41)
Albumin: 4.2 g/dL (ref 3.5–5.0)
Alkaline Phosphatase: 49 U/L (ref 38–126)
Bilirubin, Direct: <0.1 mg/dL (ref 0.0–0.2)
Total Bilirubin: 0.7 mg/dL (ref 0.0–1.2)
Total Protein: 7 g/dL (ref 6.5–8.1)

## 2023-11-15 LAB — BASIC METABOLIC PANEL
Anion gap: 10 (ref 5–15)
BUN: 21 mg/dL (ref 8–23)
CO2: 23 mmol/L (ref 22–32)
Calcium: 9.2 mg/dL (ref 8.9–10.3)
Chloride: 104 mmol/L (ref 98–111)
Creatinine, Ser: 0.69 mg/dL (ref 0.44–1.00)
GFR, Estimated: >60 mL/min (ref >60)
Glucose, Bld: 179 mg/dL — ABNORMAL HIGH (ref 70–99)
Potassium: 3.7 mmol/L (ref 3.5–5.1)
Sodium: 137 mmol/L (ref 135–145)

## 2023-11-15 LAB — RESP PANEL BY RT-PCR (RSV, FLU A&B, COVID)  RVPGX2
Influenza A by PCR: NEGATIVE
Influenza B by PCR: NEGATIVE
Resp Syncytial Virus by PCR: NEGATIVE
SARS Coronavirus 2 by RT PCR: NEGATIVE

## 2023-11-15 LAB — LIPASE, BLOOD: Lipase: 29 U/L (ref 11–51)

## 2023-11-15 MED ORDER — ONDANSETRON 4 MG PO TBDP: 4.0000 mg | ORAL_TABLET | Freq: Three times a day (TID) | ORAL | 0 refills | Status: AC | PRN

## 2023-11-15 MED ORDER — SODIUM CHLORIDE 0.9 % IV BOLUS
1000.0000 mL | Freq: Once | INTRAVENOUS | Status: AC
  Administered 2023-11-15: 1000 mL via INTRAVENOUS

## 2023-11-15 MED ORDER — ONDANSETRON 4 MG PO TBDP
4.0000 mg | ORAL_TABLET | Freq: Once | ORAL | Status: AC
  Administered 2023-11-15: 4 mg via ORAL
  Filled 2023-11-15: qty 1

## 2023-11-15 MED ORDER — ACETAMINOPHEN 325 MG PO TABS
650.0000 mg | ORAL_TABLET | Freq: Once | ORAL | Status: AC
  Administered 2023-11-15: 650 mg via ORAL
  Filled 2023-11-15: qty 2

## 2023-11-15 MED ORDER — ONDANSETRON HCL 4 MG/2ML IJ SOLN
4.0000 mg | Freq: Once | INTRAMUSCULAR | Status: AC
  Administered 2023-11-15: 4 mg via INTRAVENOUS
  Filled 2023-11-15: qty 2

## 2023-11-15 NOTE — ED Provider Triage Note (Signed)
 Emergency Medicine Provider Triage Evaluation Note  Jodi White , a 73 y.o. female  was evaluated in triage.  Pt complains of dizzy. Report hot/cold, dizzy, lightheadedness and nausea since this AM.  No runny nose, sneeze, cough, no diarrhea or constipation, no cp or sob.  Hx of vestibular migraine  Review of Systems  Positive: As above Negative: As above  Physical Exam  BP (!) 181/80   Pulse (!) 53   Temp (!) 97.5 F (36.4 C) (Oral)   Resp 18   Ht 5' 2 (1.575 m)   Wt 55.8 kg   SpO2 100%   BMI 22.50 kg/m  Gen:   Appears uncomfortable Resp:  Normal effort  MSK:   Moves extremities without difficulty  Other:    Medical Decision Making  Medically screening exam initiated at 10:01 AM.  Appropriate orders placed.  Jodi White was informed that the remainder of the evaluation will be completed by another provider, this initial triage assessment does not replace that evaluation, and the importance of remaining in the ED until their evaluation is complete.     Nivia Colon, PA-C 11/15/23 1002

## 2023-11-15 NOTE — ED Notes (Addendum)
 Ambulated patient to bathroom. Patient is unsteady at this time.  Request tylenol  for headache

## 2023-11-15 NOTE — Discharge Instructions (Addendum)
 Clinically think this is an acute viral gastritis.  And that the dizziness is related to that.  Take the Zofran  as directed every 8 hours.  If you need to you can take it every 4.  It is dissolvable Zofran .  Work on small amounts of fluid frequently with little bit sugar then advance to bland diet.  Return for any new or worse symptoms.  Make an appointment to follow-up with your [facility] family medicine.

## 2023-11-15 NOTE — ED Provider Notes (Addendum)
 [place] EMERGENCY DEPARTMENT AT [facility] Provider Note   CSN: [id number] Arrival date & time: 11/15/23  0940     History  Chief Complaint  Patient presents with   Dizziness   Nausea    Jodi White is a 73 y.o. female.  Patient awoke this morning at around 8:00.  Patient went to bed at 2200.  Upon awaking this morning patient did not feel well.  Felt very nauseated dizziness without room spinning feeling like pins-and-needles kind of all over her whole body.  Ended up vomiting multiple times.  No real diarrhea no abdominal pain.  Patient has a history of Mnire's disease and patient also does have a history of vestibular migraines.  She says it does not feel like that either.  Patient had diaphoresis.  Patient did feel hot.  Temp here though was 97.7.  Blood pressure 164/70 oxygen sats 100% on room air pulse 5058 respirations 14.       Home Medications Prior to Admission medications   Medication Sig Start Date End Date Taking? Authorizing Provider  losartan (COZAAR) 25 MG tablet Take 25 mg by mouth at bedtime. 09/13/23  Yes [provider]      Allergies    Patient has no known allergies.    Review of Systems   Review of Systems  Constitutional:  Positive for diaphoresis and fatigue. Negative for chills and fever.  HENT:  Negative for ear pain and sore throat.   Eyes:  Negative for pain and visual disturbance.  Respiratory:  Negative for cough and shortness of breath.   Cardiovascular:  Negative for chest pain and palpitations.  Gastrointestinal:  Positive for nausea and vomiting. Negative for abdominal pain and diarrhea.  Genitourinary:  Negative for dysuria and hematuria.  Musculoskeletal:  Positive for myalgias. Negative for arthralgias and back pain.  Skin:  Negative for color change and rash.  Neurological:  Positive for dizziness and headaches. Negative for seizures and syncope.  All other systems reviewed and are negative.   Physical  Exam Updated Vital Signs BP (!) 164/70 (BP Location: Left Arm)   Pulse (!) 58   Temp 97.7 F (36.5 C) (Oral)   Resp 14   Ht 1.575 m (5' 2)   Wt 55.8 kg   SpO2 100%   BMI 22.50 kg/m  Physical Exam Vitals and nursing note reviewed.  Constitutional:      General: She is not in acute distress.    Appearance: Normal appearance. She is well-developed.  HENT:     Head: Normocephalic and atraumatic.  Eyes:     Extraocular Movements: Extraocular movements intact.     Conjunctiva/sclera: Conjunctivae normal.     Pupils: Pupils are equal, round, and reactive to light.  Cardiovascular:     Rate and Rhythm: Normal rate and regular rhythm.     Heart sounds: No murmur heard. Pulmonary:     Effort: Pulmonary effort is normal. No respiratory distress.     Breath sounds: Normal breath sounds.  Abdominal:     Palpations: Abdomen is soft.     Tenderness: There is no abdominal tenderness.  Musculoskeletal:        General: No swelling.     Cervical back: Neck supple.  Skin:    General: Skin is warm and dry.     Capillary Refill: Capillary refill takes less than 2 seconds.  Neurological:     General: No focal deficit present.     Mental Status: She is  alert and oriented to person, place, and time.     Cranial Nerves: No cranial nerve deficit.     Sensory: No sensory deficit.     Motor: No weakness.     Coordination: Coordination normal.  Psychiatric:        Mood and Affect: Mood normal.     ED Results / Procedures / Treatments   Labs (all labs ordered are listed, but only abnormal results are displayed) Labs Reviewed  BASIC METABOLIC PANEL - Abnormal; Notable for the following components:      Result Value   Glucose, Bld 179 (*)    All other components within normal limits  RESP PANEL BY RT-PCR (RSV, FLU A&B, COVID)  [id number]  CBC  URINALYSIS, ROUTINE W REFLEX MICROSCOPIC  LIPASE, BLOOD  HEPATIC FUNCTION PANEL  CBG MONITORING, ED    EKG None  Radiology No results  found.  Procedures Procedures    Medications Ordered in ED Medications  sodium chloride  0.9 % bolus 1,000 mL (has no administration in time range)  ondansetron  (ZOFRAN -ODT) disintegrating tablet 4 mg (4 mg Oral Given 11/15/23 1308)    ED Course/ Medical Decision Making/ A&P                                 Medical Decision Making Amount and/or Complexity of Data Reviewed Labs: ordered. Radiology: ordered.  Risk OTC drugs. Prescription drug management.  Patient's basic metabolic panel here is normal.  Renal function is normal.  CBC normal white count 9.3.  Will add on hepatic panel and lipase because of all the vomiting.  Clinically this sounds like a viral gastritis.  Or perhaps even flu.  But no respiratory symptoms yet.  Patient felt fine yesterday.  Patient does not feel that this is true room spinning or true vertigo.  Patient without any neurodeficit on exam.  Other than this pins and needle feeling everywhere.  Will give a liter of IV fluids.  Will get CT head just to be complete.  Respiratory panel pending.  CT head without any acute findings.  Hepatic function was normal lipase was normal respiratory panel negative patient metabolic panel normal including renal function and CBC was normal.  Patient starting to feel nauseated again.  Suspect that the ODT Zofran  is wearing off.  Will give some IV Zofran .  During the course of the Zofran  patient felt much better did not have any dizziness.  Do not feel that this is true vertigo or due to a stroke.  But really feel that this is probably secondary to a stomach bug and all her nausea.  And the intense vomiting that she had at home.  Will discharge patient home with Zofran  ODT precautions.  She will return for any new or worse symptoms.  Final Clinical Impression(s) / ED Diagnoses Final diagnoses:  Nausea and vomiting, unspecified vomiting type    Rx / DC Orders ED Discharge Orders     None         Geraldene Hamilton, MD 11/15/23 1317    Geraldene Hamilton, MD 11/15/23 1639    Geraldene Hamilton, MD 11/15/23 1655

## 2023-11-15 NOTE — ED Triage Notes (Signed)
 Patient is here for evaluation of dizziness, nausea and feelings of pins and needles in bilateral legs that started around 0800 this morning. Patient reports that the tingling/pins and needles feeling started in her back and bilateral arms, then moved to her legs and is now mostly resolved. Family reports diaphoresis as well, not noted in triage at this time. Pt has hx of meniere's disease, but reports this dizziness feels very different.

## 2023-11-23 NOTE — Progress Notes (Signed)
Resp panel ordered based on pt's complaint of weakness, dizziness which may be caused by viral infection
